# Patient Record
Sex: Male | Born: 1965 | Race: Black or African American | Hispanic: No | Marital: Single | Smoking: Former smoker
Health system: Southern US, Community
[De-identification: ages and names within clinical notes are randomized; demographics above are authoritative.]

## PROBLEM LIST (undated history)

## (undated) HISTORY — PX: WRIST SURGERY: SHX841

---

## 2018-02-28 ENCOUNTER — Emergency Department (HOSPITAL_COMMUNITY): Payer: 59

## 2018-02-28 ENCOUNTER — Other Ambulatory Visit: Payer: Self-pay

## 2018-02-28 ENCOUNTER — Observation Stay (HOSPITAL_COMMUNITY)
Admission: EM | Admit: 2018-02-28 | Discharge: 2018-03-01 | Disposition: A | Discharge status: Home / Self Care | Payer: 59 | Attending: Internal Medicine | Admitting: Internal Medicine

## 2018-02-28 ENCOUNTER — Encounter (HOSPITAL_COMMUNITY): Payer: Self-pay | Admitting: Emergency Medicine

## 2018-02-28 DIAGNOSIS — E871 Hypo-osmolality and hyponatremia: Secondary | ICD-10-CM

## 2018-02-28 DIAGNOSIS — K047 Periapical abscess without sinus: Secondary | ICD-10-CM | POA: Diagnosis not present

## 2018-02-28 DIAGNOSIS — F1020 Alcohol dependence, uncomplicated: Secondary | ICD-10-CM

## 2018-02-28 DIAGNOSIS — K0889 Other specified disorders of teeth and supporting structures: Secondary | ICD-10-CM | POA: Diagnosis present

## 2018-02-28 LAB — CBC WITH DIFFERENTIAL/PLATELET
Abs Immature Granulocytes: 0.02 10*3/uL (ref 0.00–0.07)
BASOS PCT: 1 %
Basophils Absolute: 0 10*3/uL (ref 0.0–0.1)
EOS ABS: 0.2 10*3/uL (ref 0.0–0.5)
EOS PCT: 2 %
HCT: 41.7 % (ref 39.0–52.0)
Hemoglobin: 13.1 g/dL (ref 13.0–17.0)
Immature Granulocytes: 0 %
LYMPHS ABS: 2.3 10*3/uL (ref 0.7–4.0)
Lymphocytes Relative: 26 %
MCH: 28.2 pg (ref 26.0–34.0)
MCHC: 31.4 g/dL (ref 30.0–36.0)
MCV: 89.7 fL (ref 80.0–100.0)
Monocytes Absolute: 0.7 10*3/uL (ref 0.1–1.0)
Monocytes Relative: 8 %
NRBC: 0 % (ref 0.0–0.2)
Neutro Abs: 5.6 10*3/uL (ref 1.7–7.7)
Neutrophils Relative %: 63 %
Platelets: 266 10*3/uL (ref 150–400)
RBC: 4.65 MIL/uL (ref 4.22–5.81)
RDW: 12.8 % (ref 11.5–15.5)
WBC: 8.9 10*3/uL (ref 4.0–10.5)

## 2018-02-28 LAB — BASIC METABOLIC PANEL
ANION GAP: 9 (ref 5–15)
BUN: 11 mg/dL (ref 6–20)
CALCIUM: 9.2 mg/dL (ref 8.9–10.3)
CO2: 26 mmol/L (ref 22–32)
CREATININE: 1.14 mg/dL (ref 0.61–1.24)
Chloride: 98 mmol/L (ref 98–111)
GFR calc Af Amer: >60 mL/min (ref >60)
GLUCOSE: 141 mg/dL — AB (ref 70–99)
Potassium: 3.6 mmol/L (ref 3.5–5.1)
Sodium: 133 mmol/L — ABNORMAL LOW (ref 135–145)

## 2018-02-28 MED ORDER — IOHEXOL 300 MG/ML  SOLN
75.0000 mL | Freq: Once | INTRAMUSCULAR | Status: AC | PRN
  Administered 2018-02-28: 75 mL via INTRAVENOUS

## 2018-02-28 MED ORDER — IBUPROFEN 600 MG PO TABS
600.0000 mg | ORAL_TABLET | Freq: Three times a day (TID) | ORAL | Status: DC
  Administered 2018-02-28 – 2018-03-01 (×2): 600 mg via ORAL
  Filled 2018-02-28 (×2): qty 1

## 2018-02-28 MED ORDER — ONDANSETRON HCL 4 MG/2ML IJ SOLN: 4.0000 mg | Freq: Four times a day (QID) | INTRAMUSCULAR | Status: DC | PRN

## 2018-02-28 MED ORDER — SODIUM CHLORIDE 0.9 % IV SOLN
3.0000 g | Freq: Four times a day (QID) | INTRAVENOUS | Status: DC
  Administered 2018-03-01 (×2): 3 g via INTRAVENOUS
  Filled 2018-02-28 (×7): qty 3

## 2018-02-28 MED ORDER — ENOXAPARIN SODIUM 40 MG/0.4ML ~~LOC~~ SOLN
40.0000 mg | SUBCUTANEOUS | Status: DC
  Administered 2018-02-28: 40 mg via SUBCUTANEOUS
  Filled 2018-02-28: qty 0.4

## 2018-02-28 MED ORDER — POTASSIUM CHLORIDE IN NACL 40-0.9 MEQ/L-% IV SOLN
INTRAVENOUS | Status: DC
  Administered 2018-02-28: 100 mL/h via INTRAVENOUS

## 2018-02-28 MED ORDER — ACETAMINOPHEN 650 MG RE SUPP: 650.0000 mg | Freq: Four times a day (QID) | RECTAL | Status: DC | PRN

## 2018-02-28 MED ORDER — SODIUM CHLORIDE 0.9 % IV SOLN
1.5000 g | Freq: Four times a day (QID) | INTRAVENOUS | Status: DC
  Administered 2018-02-28: 1.5 g via INTRAVENOUS
  Filled 2018-02-28: qty 1.5

## 2018-02-28 MED ORDER — SODIUM CHLORIDE 0.9 % IV SOLN
INTRAVENOUS | Status: AC
  Filled 2018-02-28 (×2): qty 3

## 2018-02-28 MED ORDER — METHYLPREDNISOLONE SODIUM SUCC 125 MG IJ SOLR
80.0000 mg | Freq: Two times a day (BID) | INTRAMUSCULAR | Status: DC
  Administered 2018-02-28 – 2018-03-01 (×2): 80 mg via INTRAVENOUS
  Filled 2018-02-28 (×2): qty 2

## 2018-02-28 MED ORDER — POLYETHYLENE GLYCOL 3350 17 G PO PACK: 17.0000 g | PACK | Freq: Every day | ORAL | Status: DC | PRN

## 2018-02-28 MED ORDER — MORPHINE SULFATE (PF) 2 MG/ML IV SOLN: 2.0000 mg | INTRAVENOUS | Status: DC | PRN

## 2018-02-28 MED ORDER — ONDANSETRON HCL 4 MG PO TABS: 4.0000 mg | ORAL_TABLET | Freq: Four times a day (QID) | ORAL | Status: DC | PRN

## 2018-02-28 MED ORDER — ACETAMINOPHEN 325 MG PO TABS: 650.0000 mg | ORAL_TABLET | Freq: Four times a day (QID) | ORAL | Status: DC | PRN

## 2018-02-28 MED ORDER — CLINDAMYCIN PHOSPHATE 600 MG/50ML IV SOLN
600.0000 mg | Freq: Once | INTRAVENOUS | Status: AC
  Administered 2018-02-28: 600 mg via INTRAVENOUS
  Filled 2018-02-28: qty 50

## 2018-02-28 NOTE — ED Provider Notes (Signed)
[facility] EMERGENCY DEPARTMENT Provider Note   CSN: [id number] Arrival date & time: 02/28/18  1433     History   Chief Complaint Chief Complaint  Patient presents with  . Dental Pain    HPI Bill Whitehead is a 52 y.o. male.  52 y.o male with no PMH presents to the ED with a chief complaint of dental abscess x 4 days. Patient reports walking up with swelling around the right side of his face. He scheduled an appointment with the dentist but was told they were unable to see him until he had his infection cleared.  This is a new problem per patient, he reports no previous episodes similar to this.  He reports the pain is worse with mastication and talking.  He states he is able to tolerate liquids but has a hard time eating any solids.  Denies pain with eye movement, fever, shortness of breath, vomiting.     History reviewed. No pertinent past medical history.  There are no active problems to display for this patient.   History reviewed. No pertinent surgical history.      Home Medications    Prior to Admission medications   Not on File    Family History History reviewed. No pertinent family history.  Social History Social History   Tobacco Use  . Smoking status: Never Smoker  . Smokeless tobacco: Never Used  Substance Use Topics  . Alcohol use: Yes    Alcohol/week: 2.0 standard drinks    Types: 2 Cans of beer per week  . Drug use: Not Currently     Allergies   Patient has no known allergies.   Review of Systems Review of Systems  Constitutional: Negative for fever.  HENT: Positive for dental problem, facial swelling and trouble swallowing.      Physical Exam Updated Vital Signs BP (!) 140/100 (BP Location: Left Arm)   Pulse 82   Temp 97.7 F (36.5 C) (Tympanic)   Resp 16   Ht 5\' 10"  (1.778 m)   Wt 99.8 kg   SpO2 98%   BMI 31.57 kg/m   Physical Exam  Constitutional: He is oriented to person, place, and time. He appears well-developed  and well-nourished.  HENT:  Head: Normocephalic and atraumatic.  Mouth/Throat: Oropharynx is clear and moist. There is trismus in the jaw. Dental abscesses present.  Difficulty opening his mouth hole, trismus is present.  No abscess is seen in the posterior aspect of the right common line.  Unable to visualize oropharyngeal region.  Eyes: Pupils are equal, round, and reactive to light. No scleral icterus.  Neck: Normal range of motion.  Cardiovascular: Normal heart sounds.  Pulmonary/Chest: Effort normal and breath sounds normal. He has no wheezes. He exhibits no tenderness.  Abdominal: Soft. Bowel sounds are normal. He exhibits no distension. There is no tenderness.  Musculoskeletal: He exhibits no tenderness or deformity.  Neurological: He is alert and oriented to person, place, and time.  Skin: Skin is warm and dry.  Nursing note and vitals reviewed.    ED Treatments / Results  Labs (all labs ordered are listed, but only abnormal results are displayed) Labs Reviewed  BASIC METABOLIC PANEL - Abnormal; Notable for the following components:      Result Value   Sodium 133 (*)    Glucose, Bld 141 (*)    All other components within normal limits  CBC WITH DIFFERENTIAL/PLATELET    EKG None  Radiology Ct Soft Tissue Neck W Contrast  Result Date: 02/28/2018 CLINICAL DATA:  52 y/o M; right upper and lower jaw pain with swelling since 02/24/2018. Sore throat. EXAM: CT NECK WITH CONTRAST TECHNIQUE: Multidetector CT imaging of the neck was performed using the standard protocol following the bolus administration of intravenous contrast. CONTRAST:  75mL OMNIPAQUE IOHEXOL 300 MG/ML  SOLN COMPARISON:  None. FINDINGS: Pharynx and larynx: Normal. No mass or swelling. Salivary glands: No inflammation, mass, or stone. Thyroid: Normal. Lymph nodes: Right upper cervical and submandibular lymph nodes are enlarged, likely reactive. No lymph node necrosis. Vascular: Negative. Limited intracranial:  Negative. Visualized orbits: Negative. Mastoids and visualized paranasal sinuses: Right greater than left maxillary sinus mucosal thickening. Skeleton: Dental carie and periapical cyst of the right posterior mandibular molar. There is a defect in the medial cortex of the mandible arising from the periapical cyst. There is a 5 mm fluid collection within the soft tissues medial to the mandible at the site of the bony defect with faint peripheral enhancement (series 2, image 37). There is mild inflammatory changes within the right medial masticator compartment, right parapharyngeal space, and in the superficial soft tissues overlying the right mandible and maxilla. Upper chest: Negative. Other: None. IMPRESSION: 1. 5 mm odontogenic abscess along the medial margin of the right mandible angle, likely associated with dental disease of the right posterior mandibular molar. 2. Mild surrounding inflammation in the right parapharyngeal space, right masticator compartment, and right face superficial soft tissues overlying mandible and maxilla. Electronically Signed   By: Mitzi Hansen M.D.   On: 02/28/2018 16:23    Procedures Procedures (including critical care time)  Medications Ordered in ED Medications  clindamycin (CLEOCIN) IVPB 600 mg (600 mg Intravenous New Bag/Given 02/28/18 1733)  iohexol (OMNIPAQUE) 300 MG/ML solution 75 mL (75 mLs Intravenous Contrast Given 02/28/18 1601)     Initial Impression / Assessment and Plan / ED Course  I have reviewed the triage vital signs and the nursing notes.  Pertinent labs & imaging results that were available during my care of the patient were reviewed by me and considered in my medical decision making (see chart for details).    He presents with abscess on the right side of his face along with increased swelling since Friday.  During examination it I am unable to fully visualize the oropharynx, or uvula.  I have discussed this patient with Dr. Jodi Mourning who  has also seen the patient with me and further recommendations are for [place] to obtain a CT soft tissue neck to further rule out any deeper involvement of this abscess.  Order CT scan along with some basic blood work to rule out any infection.  CT soft tissue neck showed: 1. 5 mm odontogenic abscess along the medial margin of the right  mandible angle, likely associated with dental disease of the right  posterior mandibular molar.  2. Mild surrounding inflammation in the right parapharyngeal space,  right masticator compartment, and right face superficial soft  tissues overlying mandible and maxilla.   No dental, [facility] on call availability will place call for ENT, I have discussed this case with Dr. Dione Housekeeper who advised call ENT Dr. Pollyann Kennedy. 4:55 PM to Dr. Pollyann Kennedy who "stated if there is no airway compromise, I will need to call administration and have them arrange for dental, OMFS coverage to be available at this time ".  Attempted to read the report to Dr. Pollyann Kennedy and he reports this is not an ENT concern.  I discussed this with Dr. Clarene Duke who advised  to call Peds dental we will try consulting them.  5:09 PM Consult peds for their recommendations.  5:12 PM Spoke to peds dentist on call who advised patient needs to receive IV antibiotics at this time.Will call hospitalist to admit patient for IV antibiotics.   5:41 PM Spoke to hospitalist who will admit patient for IV antibiotics.   Final Clinical Impressions(s) / ED Diagnoses   Final diagnoses:  Dental abscess    ED Discharge Orders    None       Claude Manges, PA-C 02/28/18 1742    Blane Ohara, MD 03/01/18 [phone]

## 2018-02-28 NOTE — ED Triage Notes (Signed)
Pt has been having R upper and lower jaw pain for several days. Cannot see dentist until "infection" is gone. Denies dental caries or broken teeth.

## 2018-02-28 NOTE — Progress Notes (Addendum)
Pharmacy Antibiotic Note  Bill Whitehead is a 52 y.o. male admitted on 02/28/2018 with dental abscess.  Pharmacy has been consulted for unasyn dosing.  Plan: unasyn 3gm iv q6h  Height: 5\' 10"  (177.8 cm) Weight: 220 lb (99.8 kg) IBW/kg (Calculated) : 73  Temp (24hrs), Avg:97.7 F (36.5 C), Min:97.7 F (36.5 C), Max:97.7 F (36.5 C)  Recent Labs  Lab 02/28/18 1513 02/28/18 1525  WBC 8.9  --   CREATININE  --  1.14    Estimated Creatinine Clearance: 91.8 mL/min (by C-G formula based on SCr of 1.14 mg/dL).    No Known Allergies  Antimicrobials this admission: 11/5 unasyn >>  11/5 clindamycin x 1   Microbiology results: none  Thank you for allowing pharmacy to be a part of this patient's care.  Bill Whitehead 02/28/2018 7:46 PM

## 2018-02-28 NOTE — H&P (Signed)
History and Physical    Bill Whitehead:[id number] DOB: 09/30/1965 DOA: 02/28/2018  PCP: Bill Whitehead, No Pcp Per   Bill Whitehead coming from: Home  Chief Complaint: Right jaw pain and swelling  HPI: Bill Whitehead is a 52 y.o. male with no significant past medical history, who presented to the ED with complaints of right jaw pain and swelling over the past 4 days.  Bill Whitehead is unable to open his mouth, or eat, he has barely eating in the past few days.  He reports pain with swallowing and chewing but no difficulty breathing.  No fever, no chills, no vomiting.  No prior history of dental problems. Bill Whitehead called his dentist first but was told he needed to get the infection treated first, before he is seen for problems with his tooth.  ED Course: Stable Vitals.  WBC 8.9.  Elevated glucose- 141, otherwise unremarkable CBC, BMP.  CT soft tissue neck with contrast- odontogenic abscess along the medial margin of the right month angle, likely associated with dental disease of the right posterior mandibular molar.  Mild surrounding inflammation in the right parapharyngeal space.  IV clindamycin started in ED. No dental surgeon on call. EDP contacted ENT on call- but recommendations were not given, as this was a dental problems.  Pediatric dentist on call, Dr. Millner-recommended admission to the hospital for IV antibiotics and at this time did not think this needed surgical evaluation.  Review of Systems: As per HPI all other systems reviewed and negative  Reviewed- Bill Whitehead has no past medical history  History reviewed. No pertinent surgical history.   reports that he has never smoked. He has never used smokeless tobacco. He reports that he drinks about 2.0 standard drinks of alcohol per week. He reports that he has current or past drug history.  No Known Allergies  Family History  Problem Relation Age of Onset  . Diabetes Mother     Prior to Admission medications   Not on File    Physical  Exam: Vitals:   02/28/18 1439 02/28/18 1730 02/28/18 1800 02/28/18 1830  BP: (!) 145/96 (!) 140/100 128/86 (!) 135/93  Pulse: 91 82 77 83  Resp: 20 16 16 16   Temp: 97.7 F (36.5 C)     TempSrc: Tympanic     SpO2: 94% 98% 95% 99%  Weight: 99.8 kg     Height: 5\' 10"  (1.778 m)       Constitutional: NAD, calm, comfortable Vitals:   02/28/18 1439 02/28/18 1730 02/28/18 1800 02/28/18 1830  BP: (!) 145/96 (!) 140/100 128/86 (!) 135/93  Pulse: 91 82 77 83  Resp: 20 16 16 16   Temp: 97.7 F (36.5 C)     TempSrc: Tympanic     SpO2: 94% 98% 95% 99%  Weight: 99.8 kg     Height: 5\' 10"  (1.778 m)      Eyes: PERRL, lids and conjunctivae normal ENMT: Mucous membranes are moist.  Unable to visualize posterior pharynx.  Unable to open his mouth > 4cm 2/2 pain, swelling to mild- moderate swelling to right side of face, with tenderness on mild palpation, extending to right cervical region.  neck: normal, supple, no masses, no thyromegaly Respiratory: clear to auscultation bilaterally, no wheezing, no crackles. Normal respiratory effort. No accessory muscle use.  Cardiovascular: Regular rate and rhythm, no murmurs / rubs / gallops. No extremity edema. 2+ pedal pulses. No carotid bruits.  Abdomen: no tenderness, no masses palpated. No hepatosplenomegaly. Bowel sounds positive.  Musculoskeletal: no  clubbing / cyanosis. No joint deformity upper and lower extremities. Good ROM, no contractures. Normal muscle tone.  Skin: no rashes, lesions, ulcers. No induration Neurologic: CN 2-12 grossly intact. Sensation intact, DTR normal. Strength 5/5 in all 4.  Psychiatric: Normal judgment and insight. Alert and oriented x 3. Normal mood.   Labs on Admission: I have personally reviewed following labs and imaging studies  CBC: Recent Labs  Lab 02/28/18 1513  WBC 8.9  NEUTROABS 5.6  HGB 13.1  HCT 41.7  MCV 89.7  PLT 266   Basic Metabolic Panel: Recent Labs  Lab 02/28/18 1525  NA 133*  K 3.6  CL 98    CO2 26  GLUCOSE 141*  BUN 11  CREATININE 1.14  CALCIUM 9.2   Urine analysis: No results found for: COLORURINE, APPEARANCEUR, LABSPEC, PHURINE, GLUCOSEU, HGBUR, BILIRUBINUR, KETONESUR, PROTEINUR, UROBILINOGEN, NITRITE, LEUKOCYTESUR  Radiological Exams on Admission: Ct Soft Tissue Neck W Contrast  Result Date: 02/28/2018 CLINICAL DATA:  52 y/o M; right upper and lower jaw pain with swelling since 02/24/2018. Sore throat. EXAM: CT NECK WITH CONTRAST TECHNIQUE: Multidetector CT imaging of the neck was performed using the standard protocol following the bolus administration of intravenous contrast. CONTRAST:  75mL OMNIPAQUE IOHEXOL 300 MG/ML  SOLN COMPARISON:  None. FINDINGS: Pharynx and larynx: Normal. No mass or swelling. Salivary glands: No inflammation, mass, or stone. Thyroid: Normal. Lymph nodes: Right upper cervical and submandibular lymph nodes are enlarged, likely reactive. No lymph node necrosis. Vascular: Negative. Limited intracranial: Negative. Visualized orbits: Negative. Mastoids and visualized paranasal sinuses: Right greater than left maxillary sinus mucosal thickening. Skeleton: Dental carie and periapical cyst of the right posterior mandibular molar. There is a defect in the medial cortex of the mandible arising from the periapical cyst. There is a 5 mm fluid collection within the soft tissues medial to the mandible at the site of the bony defect with faint peripheral enhancement (series 2, image 37). There is mild inflammatory changes within the right medial masticator compartment, right parapharyngeal space, and in the superficial soft tissues overlying the right mandible and maxilla. Upper chest: Negative. Other: None. IMPRESSION: 1. 5 mm odontogenic abscess along the medial margin of the right mandible angle, likely associated with dental disease of the right posterior mandibular molar. 2. Mild surrounding inflammation in the right parapharyngeal space, right masticator compartment,  and right face superficial soft tissues overlying mandible and maxilla. Electronically Signed   By: Mitzi Hansen M.D.   On: 02/28/2018 16:23    EKG: None.  Assessment/Plan Active Problems:   Dental abscess   Dental abscess-CT soft tissue neck with contrast- 5mm odontogenic abscess right mandible angle, mild surrounding inflammation right parapharyngeal space.  IV clindamycin started in ED -Abscess quite small, but recommend talking to oral surgeon on call tomorrow before 4 PM, if surgical evaluation needed. -Will start IV Unasyn-per pharmacy for better strep and anaerobic coverage -HIV -Glucose 141, diabetic mother, check hemoglobin A1c -IV fluid normal saline +40 KCl 100 cc/h x 15 hours -IV Solu-Medrol 80 twice daily x3 doses -Ibuprofen 600mg  X 3 dose - Morphine 2mg  q4h PRN -Full liquid diet advance as tolerated   DVT prophylaxis: Lovenox Code Status: full Family Communication: None at bedside Disposition Plan: Per rounding team Consults called: none Admission status: Inpatient, med-surg   Onnie Boer MD [facility] Pager [phone] From 3PM-11PM.  Otherwise please contact night-coverage [email or web address] Password [email or web address]   02/28/2018, 8:00 PM

## 2018-03-01 DIAGNOSIS — E871 Hypo-osmolality and hyponatremia: Secondary | ICD-10-CM | POA: Diagnosis not present

## 2018-03-01 DIAGNOSIS — K047 Periapical abscess without sinus: Secondary | ICD-10-CM | POA: Diagnosis not present

## 2018-03-01 DIAGNOSIS — F1020 Alcohol dependence, uncomplicated: Secondary | ICD-10-CM | POA: Diagnosis not present

## 2018-03-01 LAB — HEMOGLOBIN A1C
HEMOGLOBIN A1C: 5.9 % — AB (ref 4.8–5.6)
MEAN PLASMA GLUCOSE: 122.63 mg/dL

## 2018-03-01 MED ORDER — AMOXICILLIN-POT CLAVULANATE 875-125 MG PO TABS
1.0000 | ORAL_TABLET | Freq: Two times a day (BID) | ORAL | Status: DC
  Administered 2018-03-01: 1 via ORAL
  Filled 2018-03-01: qty 1

## 2018-03-01 MED ORDER — AMOXICILLIN-POT CLAVULANATE 875-125 MG PO TABS: 1.0000 | ORAL_TABLET | Freq: Two times a day (BID) | ORAL | 0 refills | Status: DC

## 2018-03-01 MED ORDER — ENOXAPARIN SODIUM 60 MG/0.6ML ~~LOC~~ SOLN: 50.0000 mg | SUBCUTANEOUS | Status: DC

## 2018-03-01 NOTE — Progress Notes (Signed)
Pt IV removed, WNL. D/C instructions given to pt, verbalized understanding. Pt awaiting [facility] transport to take to oral surgeon via hospitalist orders.

## 2018-03-01 NOTE — Discharge Summary (Signed)
Physician Discharge Summary  Bill Whitehead JXB:[id number] DOB: 1965/04/28 DOA: 02/28/2018  PCP: Patient, No Pcp Per  Admit date: 02/28/2018 Discharge date: 03/01/2018  Admitted From: Home Disposition:  Home   Recommendations for Outpatient Follow-up:  1. Follow up with PCP in 1-2 weeks 2. Please obtain BMP/CBC in one week    Discharge Condition: Stable CODE STATUS: FULL Diet recommendation:  Regular   Brief/Interim Summary: 52 year old male with no documented chronic medical problems presented with 4-day history of right jaw pain and swelling.  The patient first noted some dental pain in his lower molar area on the right on 02/23/2018.  The patient took some over-the-counter pain relievers.  When he woke up in the morning of 02/24/2018, he had significant worsening, but did not seek any medical attention.  He denied any fevers, chills, chest pain, shortness breath, nausea, vomiting or diarrhea.  His pain and swelling progressed to the point where he is unable to open his mouth and eat.  As result, the patient presented to emergency department for further evaluation.  He stated he tried to call a dentist on 02/28/2018, and he stated that he was told that he would need antibiotics before further treatment.  Upon presentation, the patient was afebrile hemodynamically stable saturating 98% on room air.  WBC was 18.9.  CT of the neck showed dental caries and periapical cyst of the right posterior molar.  There was a 5 mm fluid collection along the medial margin of the right mandible angle likely associated with dental disease.  The patient was started on IV Unasyn and IV Solu-Medrol.  Over a period of 24 hours, the patient has significant improvement.  Consult was discussed with oral surgery, Dr. Ocie Doyne.  Dr. Barbette Merino was gracious and willing to see the patient in the office in the afternoon of 03/01/2018.  This was discussed with the patient, and the patient felt comfortable with the plan and the  patient stated that he was willing to arrive at Dr. Henriette Combs office on the afternoon of 03/01/2018 to be evaluated further.  At the time of discharge, the patient was able to open his mouth and tolerate oral intake.  Discharge Diagnoses:  Odontogenic abscess -Initially started on IV Unasyn -Received 2 doses IV Solu-Medrol -CT neck as discussed above -Case discussed with oral surgery, Dr. Ocie Doyne who agreed to see the patient in the office after discharge -Instructions provided to the patient to go to Dr. Lorin Picket Johnson's office to be seen in the afternoon of 03/01/2018 -Discharge home with amox/clav  Alcohol dependence -No signs of withdrawal -The patient states that he drinks 2 x 40 oz beers daily -Cessation discussed  Hyponatremia -Secondary to volume depletion -Patient received IV saline during the hospitalization  Discharge Instructions   Allergies as of 03/01/2018   No Known Allergies     Medication List    STOP taking these medications   ibuprofen 200 MG tablet Commonly known as:  ADVIL,MOTRIN     TAKE these medications   amoxicillin-clavulanate 875-125 MG tablet Commonly known as:  AUGMENTIN Take 1 tablet by mouth every 12 (twelve) hours.       No Known Allergies  Consultations:  Oral surgery--Dr. Ocie Doyne   Procedures/Studies: Ct Soft Tissue Neck W Contrast  Result Date: 02/28/2018 CLINICAL DATA:  52 y/o M; right upper and lower jaw pain with swelling since 02/24/2018. Sore throat. EXAM: CT NECK WITH CONTRAST TECHNIQUE: Multidetector CT imaging of the neck was performed using the standard protocol  following the bolus administration of intravenous contrast. CONTRAST:  75mL OMNIPAQUE IOHEXOL 300 MG/ML  SOLN COMPARISON:  None. FINDINGS: Pharynx and larynx: Normal. No mass or swelling. Salivary glands: No inflammation, mass, or stone. Thyroid: Normal. Lymph nodes: Right upper cervical and submandibular lymph nodes are enlarged, likely reactive. No lymph  node necrosis. Vascular: Negative. Limited intracranial: Negative. Visualized orbits: Negative. Mastoids and visualized paranasal sinuses: Right greater than left maxillary sinus mucosal thickening. Skeleton: Dental carie and periapical cyst of the right posterior mandibular molar. There is a defect in the medial cortex of the mandible arising from the periapical cyst. There is a 5 mm fluid collection within the soft tissues medial to the mandible at the site of the bony defect with faint peripheral enhancement (series 2, image 37). There is mild inflammatory changes within the right medial masticator compartment, right parapharyngeal space, and in the superficial soft tissues overlying the right mandible and maxilla. Upper chest: Negative. Other: None. IMPRESSION: 1. 5 mm odontogenic abscess along the medial margin of the right mandible angle, likely associated with dental disease of the right posterior mandibular molar. 2. Mild surrounding inflammation in the right parapharyngeal space, right masticator compartment, and right face superficial soft tissues overlying mandible and maxilla. Electronically Signed   By: Mitzi Hansen M.D.   On: 02/28/2018 16:23         Discharge Exam: Vitals:   02/28/18 2029 03/01/18 0614  BP: (!) 137/99 124/82  Pulse: 93 75  Resp: 18 18  Temp: 99.1 F (37.3 C) 98.2 F (36.8 C)  SpO2: 97% 97%   Vitals:   02/28/18 1800 02/28/18 1830 02/28/18 2029 03/01/18 0614  BP: 128/86 (!) 135/93 (!) 137/99 124/82  Pulse: 77 83 93 75  Resp: 16 16 18 18   Temp:   99.1 F (37.3 C) 98.2 F (36.8 C)  TempSrc:    Oral  SpO2: 95% 99% 97% 97%  Weight:    108.5 kg  Height:        General: Pt is alert, awake, not in acute distress Cardiovascular: RRR, S1/S2 +, no rubs, no gallops Respiratory: CTA bilaterally, no wheezing, no rhonchi Abdominal: Soft, NT, ND, bowel sounds + Extremities: no edema, no cyanosis -mouth--impacted molar/wisdom tooth, right lower jaw  without surrounding necrosis;  Shotty anterior cervical lymphadenopathy without erythema or crepitance   The results of significant diagnostics from this hospitalization (including imaging, microbiology, ancillary and laboratory) are listed below for reference.    Significant Diagnostic Studies: Ct Soft Tissue Neck W Contrast  Result Date: 02/28/2018 CLINICAL DATA:  52 y/o M; right upper and lower jaw pain with swelling since 02/24/2018. Sore throat. EXAM: CT NECK WITH CONTRAST TECHNIQUE: Multidetector CT imaging of the neck was performed using the standard protocol following the bolus administration of intravenous contrast. CONTRAST:  75mL OMNIPAQUE IOHEXOL 300 MG/ML  SOLN COMPARISON:  None. FINDINGS: Pharynx and larynx: Normal. No mass or swelling. Salivary glands: No inflammation, mass, or stone. Thyroid: Normal. Lymph nodes: Right upper cervical and submandibular lymph nodes are enlarged, likely reactive. No lymph node necrosis. Vascular: Negative. Limited intracranial: Negative. Visualized orbits: Negative. Mastoids and visualized paranasal sinuses: Right greater than left maxillary sinus mucosal thickening. Skeleton: Dental carie and periapical cyst of the right posterior mandibular molar. There is a defect in the medial cortex of the mandible arising from the periapical cyst. There is a 5 mm fluid collection within the soft tissues medial to the mandible at the site of the bony defect with faint peripheral  enhancement (series 2, image 37). There is mild inflammatory changes within the right medial masticator compartment, right parapharyngeal space, and in the superficial soft tissues overlying the right mandible and maxilla. Upper chest: Negative. Other: None. IMPRESSION: 1. 5 mm odontogenic abscess along the medial margin of the right mandible angle, likely associated with dental disease of the right posterior mandibular molar. 2. Mild surrounding inflammation in the right parapharyngeal space, right  masticator compartment, and right face superficial soft tissues overlying mandible and maxilla. Electronically Signed   By: Mitzi Hansen M.D.   On: 02/28/2018 16:23     Microbiology: No results found for this or any previous visit (from the past 240 hour(s)).   Labs: Basic Metabolic Panel: Recent Labs  Lab 02/28/18 1525  NA 133*  K 3.6  CL 98  CO2 26  GLUCOSE 141*  BUN 11  CREATININE 1.14  CALCIUM 9.2   Liver Function Tests: No results for input(s): AST, ALT, ALKPHOS, BILITOT, PROT, ALBUMIN in the last 168 hours. No results for input(s): LIPASE, AMYLASE in the last 168 hours. No results for input(s): AMMONIA in the last 168 hours. CBC: Recent Labs  Lab 02/28/18 1513  WBC 8.9  NEUTROABS 5.6  HGB 13.1  HCT 41.7  MCV 89.7  PLT 266   Cardiac Enzymes: No results for input(s): CKTOTAL, CKMB, CKMBINDEX, TROPONINI in the last 168 hours. BNP: Invalid input(s): POCBNP CBG: No results for input(s): GLUCAP in the last 168 hours.  Time coordinating discharge:  36 minutes  Signed:  [name], DO [facility] Pager: [phone] 03/01/2018, 7:53 AM

## 2018-03-02 LAB — HIV ANTIBODY (ROUTINE TESTING W REFLEX): HIV SCREEN 4TH GENERATION: NONREACTIVE

## 2019-04-22 IMAGING — CT CT NECK W/ CM
4 of 5 series · 15 of 33 positions shown, 17 images · IV contrast (omnipaque)
Comparison: None.

CLINICAL DATA: 52 y/o M; right upper and lower jaw pain with
swelling since 02/24/2018. Sore throat.

EXAM:
CT NECK WITH CONTRAST
TECHNIQUE: Multidetector CT imaging of the neck was performed using the
standard protocol following the bolus administration of intravenous
contrast.
CONTRAST:  75mL OMNIPAQUE IOHEXOL 300 MG/ML  SOLN

[Series 2: axial neck · axial · 0.51mm/px · z∈[+1070,+1166]mm · 3 of 120 slices shown]
[im 24/120  bone]
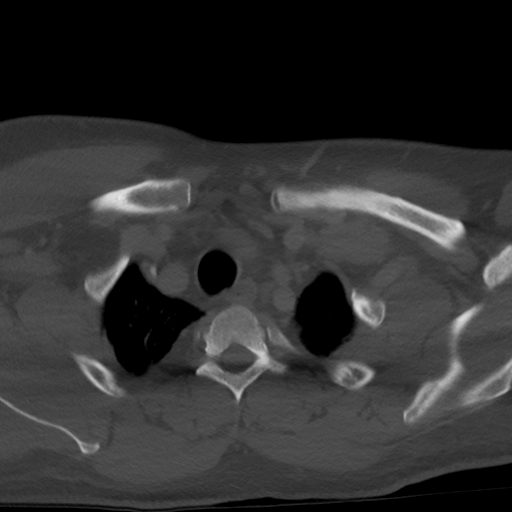
[im 48/120  bone]
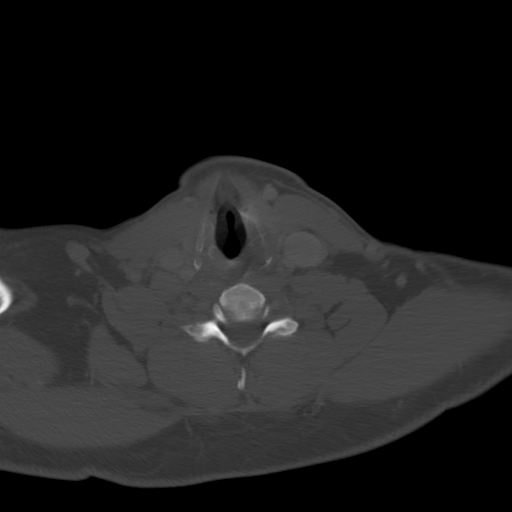
[im 72/120  bone]
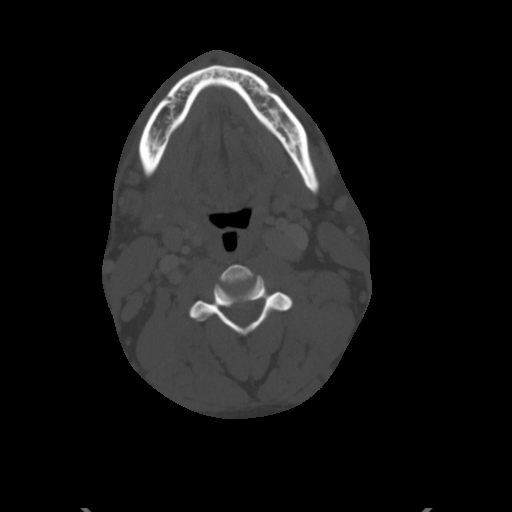

[Series 6: coronal neck · coronal · 0.51mm/px · 3 of 109 slices shown]
[im 22/109  bone]
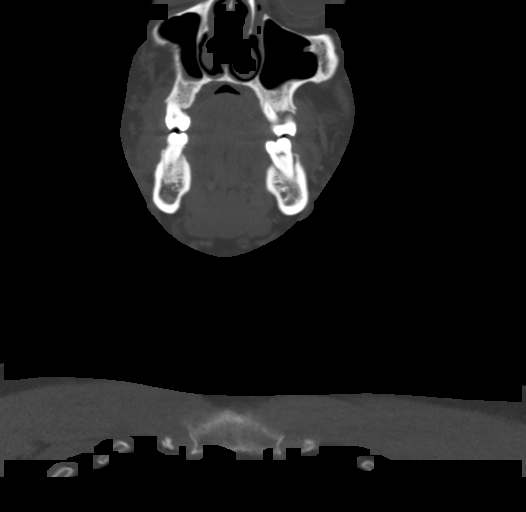
[im 44/109  bone]
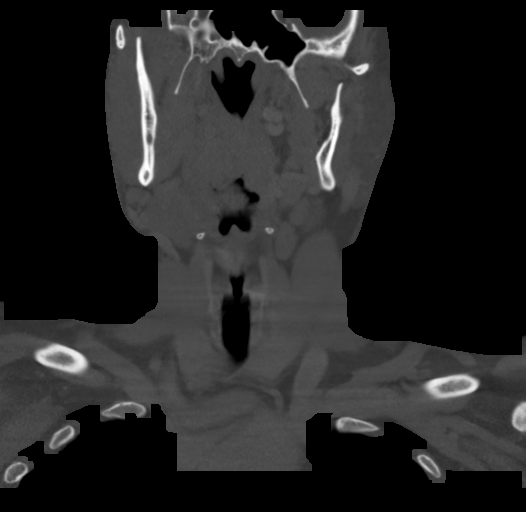
[im 65/109  bone]
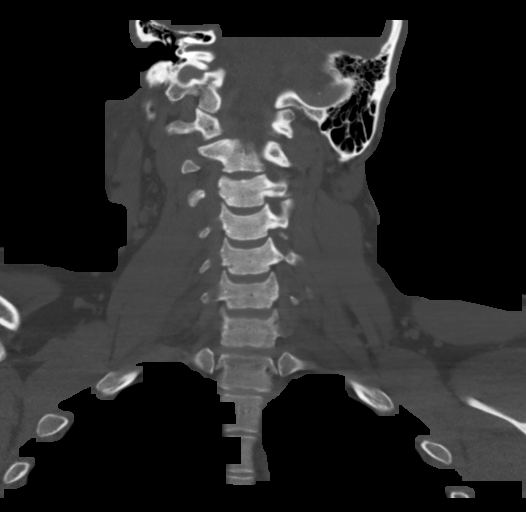

[Series 7: sagittal neck · sagittal · 0.47mm/px · 5 of 101 slices shown, 6 images]
[im 34/101  bone]
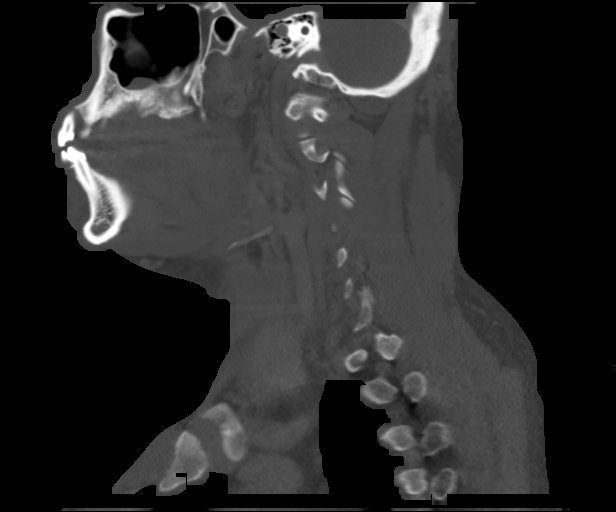
[im 42/101  bone]
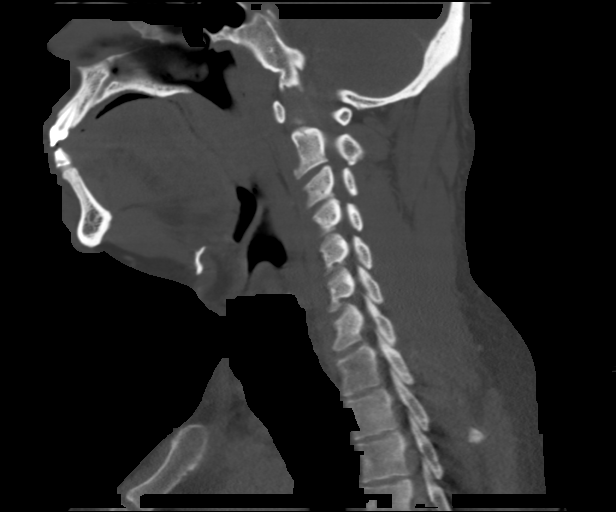
[im 51/101  soft-tissue]
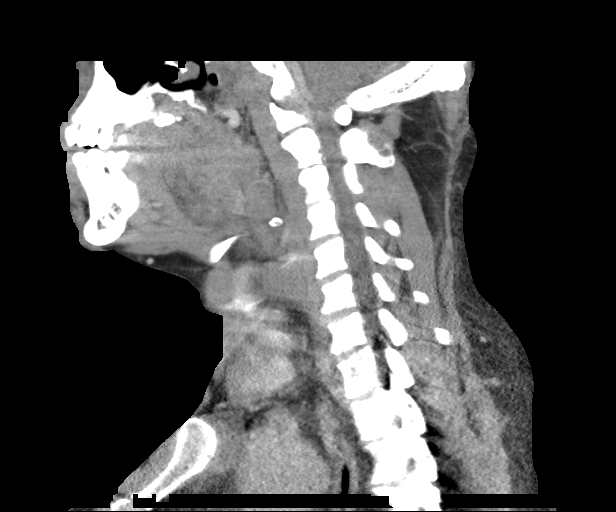
[im 51/101  bone]
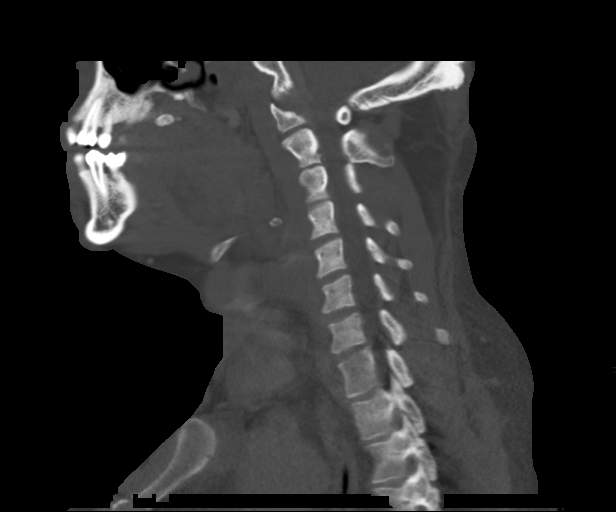
[im 59/101  bone]
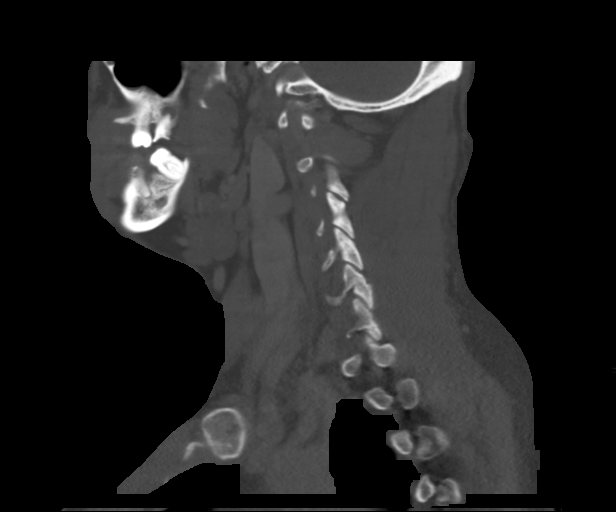
[im 67/101  bone]
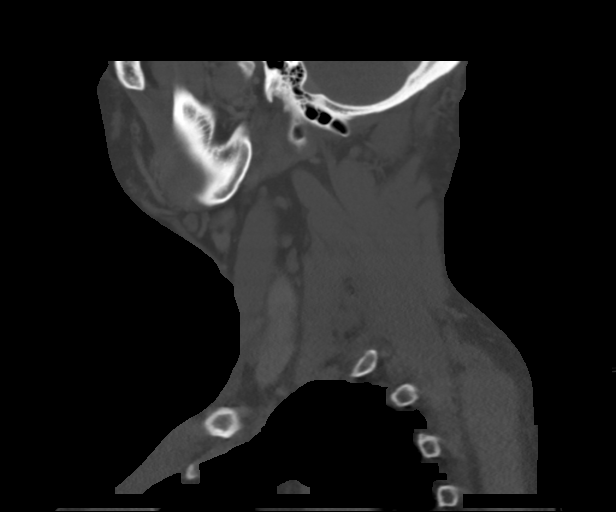

[Series 8: orthogonal ax · axial · 0.39mm/px · z∈[+1046,+1206]mm · 4 of 137 slices shown, 5 images]
[im 28/137  soft-tissue]
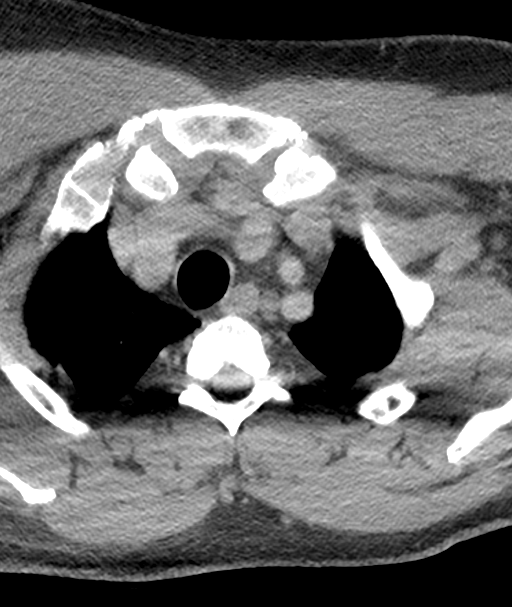
[im 28/137  bone]
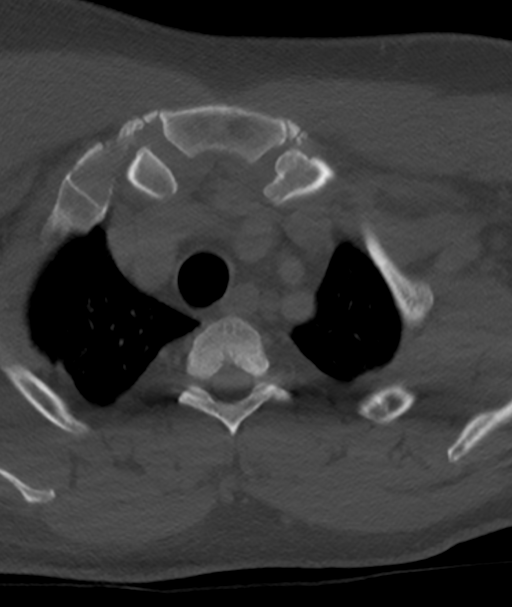
[im 55/137  bone]
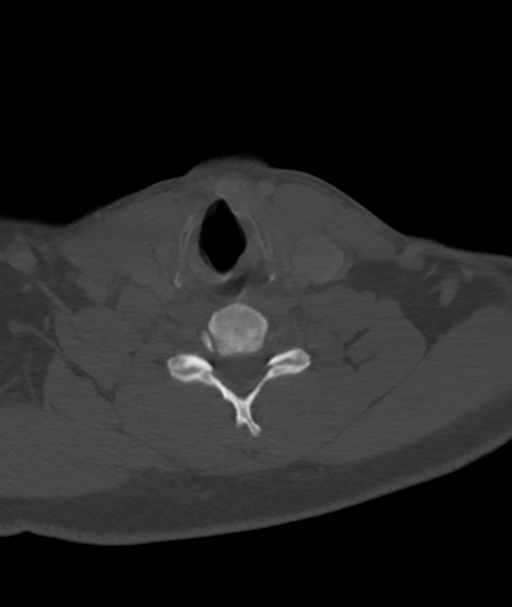
[im 82/137  bone]
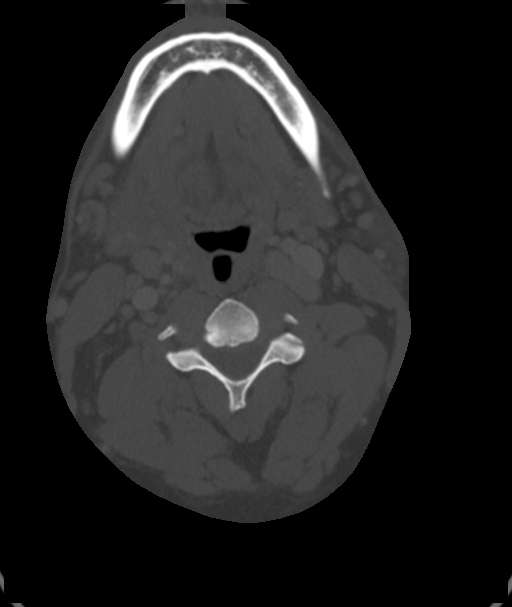
[im 109/137  bone]
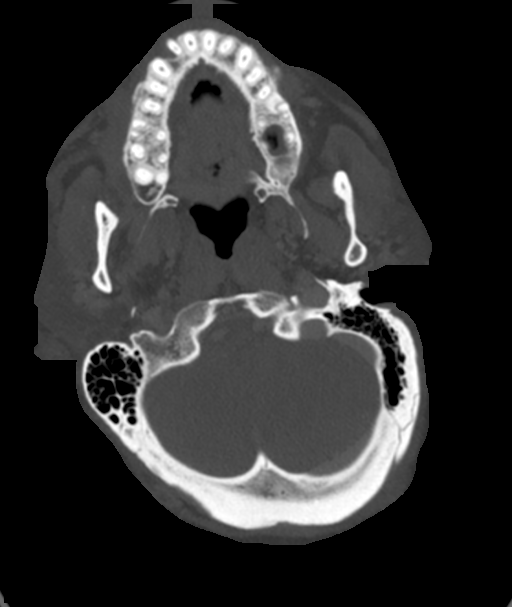

[15 of 33 positions shown; findings below may reference images not displayed]

FINDINGS: Pharynx and larynx: Normal. No mass or swelling.

Salivary glands: No inflammation, mass, or stone.

Thyroid: Normal.

Lymph nodes: Right upper cervical and submandibular lymph nodes are
enlarged, likely reactive. No lymph node necrosis.

Vascular: Negative.

Limited intracranial: Negative.

Visualized orbits: Negative.

Mastoids and visualized paranasal sinuses: Right greater than left
maxillary sinus mucosal thickening.

Skeleton: Dental Boris Ivan and periapical cyst of the right posterior
mandibular molar. There is a defect in the medial cortex of the
mandible arising from the periapical cyst. There is a 5 mm fluid
collection within the soft tissues medial to the mandible at the
site of the bony defect with faint peripheral enhancement (series 2,
image 37). There is mild inflammatory changes within the right
medial masticator compartment, right parapharyngeal space, and in
the superficial soft tissues overlying the right mandible and
maxilla.

Upper chest: Negative.

Other: None.
IMPRESSION: 1. 5 mm odontogenic abscess along the medial margin of the right
mandible angle, likely associated with dental disease of the right
posterior mandibular molar.
2. Mild surrounding inflammation in the right parapharyngeal space,
right masticator compartment, and right face superficial soft
tissues overlying mandible and maxilla.

## 2020-02-04 ENCOUNTER — Encounter (HOSPITAL_COMMUNITY): Payer: Self-pay | Admitting: *Deleted

## 2020-02-04 ENCOUNTER — Emergency Department (HOSPITAL_COMMUNITY): Payer: HRSA Program

## 2020-02-04 ENCOUNTER — Other Ambulatory Visit: Payer: Self-pay

## 2020-02-04 ENCOUNTER — Inpatient Hospital Stay (HOSPITAL_COMMUNITY)
Admission: EM | Admit: 2020-02-04 | Discharge: 2020-02-07 | DRG: 177 | Disposition: A | Discharge status: Home / Self Care | Payer: HRSA Program | Attending: Family Medicine | Admitting: Family Medicine

## 2020-02-04 DIAGNOSIS — Z6832 Body mass index (BMI) 32.0-32.9, adult: Secondary | ICD-10-CM | POA: Diagnosis not present

## 2020-02-04 DIAGNOSIS — U071 COVID-19: Secondary | ICD-10-CM | POA: Diagnosis present

## 2020-02-04 DIAGNOSIS — J1282 Pneumonia due to coronavirus disease 2019: Secondary | ICD-10-CM | POA: Diagnosis present

## 2020-02-04 DIAGNOSIS — J069 Acute upper respiratory infection, unspecified: Secondary | ICD-10-CM | POA: Diagnosis not present

## 2020-02-04 DIAGNOSIS — E669 Obesity, unspecified: Secondary | ICD-10-CM | POA: Diagnosis present

## 2020-02-04 DIAGNOSIS — R0902 Hypoxemia: Secondary | ICD-10-CM | POA: Diagnosis present

## 2020-02-04 DIAGNOSIS — J9601 Acute respiratory failure with hypoxia: Secondary | ICD-10-CM | POA: Diagnosis present

## 2020-02-04 DIAGNOSIS — F1010 Alcohol abuse, uncomplicated: Secondary | ICD-10-CM | POA: Diagnosis present

## 2020-02-04 LAB — COMPREHENSIVE METABOLIC PANEL
ALT: 45 U/L — ABNORMAL HIGH (ref 0–44)
AST: 45 U/L — ABNORMAL HIGH (ref 15–41)
Albumin: 4.1 g/dL (ref 3.5–5.0)
Alkaline Phosphatase: 46 U/L (ref 38–126)
Anion gap: 14 (ref 5–15)
BUN: 15 mg/dL (ref 6–20)
CO2: 23 mmol/L (ref 22–32)
Calcium: 9 mg/dL (ref 8.9–10.3)
Chloride: 97 mmol/L — ABNORMAL LOW (ref 98–111)
Creatinine, Ser: 1.31 mg/dL — ABNORMAL HIGH (ref 0.61–1.24)
GFR, Estimated: >60 mL/min (ref >60)
Glucose, Bld: 106 mg/dL — ABNORMAL HIGH (ref 70–99)
Potassium: 4 mmol/L (ref 3.5–5.1)
Sodium: 134 mmol/L — ABNORMAL LOW (ref 135–145)
Total Bilirubin: 1.3 mg/dL — ABNORMAL HIGH (ref 0.3–1.2)
Total Protein: 8.6 g/dL — ABNORMAL HIGH (ref 6.5–8.1)

## 2020-02-04 LAB — CBC WITH DIFFERENTIAL/PLATELET
Abs Immature Granulocytes: 0.07 10*3/uL (ref 0.00–0.07)
Basophils Absolute: 0 10*3/uL (ref 0.0–0.1)
Basophils Relative: 0 %
Eosinophils Absolute: 0 10*3/uL (ref 0.0–0.5)
Eosinophils Relative: 0 %
HCT: 42.2 % (ref 39.0–52.0)
Hemoglobin: 13.7 g/dL (ref 13.0–17.0)
Immature Granulocytes: 1 %
Lymphocytes Relative: 13 %
Lymphs Abs: 1.3 10*3/uL (ref 0.7–4.0)
MCH: 29 pg (ref 26.0–34.0)
MCHC: 32.5 g/dL (ref 30.0–36.0)
MCV: 89.4 fL (ref 80.0–100.0)
Monocytes Absolute: 0.4 10*3/uL (ref 0.1–1.0)
Monocytes Relative: 4 %
Neutro Abs: 8.2 10*3/uL — ABNORMAL HIGH (ref 1.7–7.7)
Neutrophils Relative %: 82 %
Platelets: 222 10*3/uL (ref 150–400)
RBC: 4.72 MIL/uL (ref 4.22–5.81)
RDW: 13.9 % (ref 11.5–15.5)
WBC: 10 10*3/uL (ref 4.0–10.5)
nRBC: 0 % (ref 0.0–0.2)

## 2020-02-04 LAB — D-DIMER, QUANTITATIVE: D-Dimer, Quant: 1.75 ug/mL-FEU — ABNORMAL HIGH (ref 0.00–0.50)

## 2020-02-04 LAB — FIBRINOGEN: Fibrinogen: >800 mg/dL — ABNORMAL HIGH (ref 210–475)

## 2020-02-04 LAB — LACTIC ACID, PLASMA
Lactic Acid, Venous: 2.1 mmol/L (ref 0.5–1.9)
Lactic Acid, Venous: 1.1 mmol/L (ref 0.5–1.9)

## 2020-02-04 LAB — RESPIRATORY PANEL BY RT PCR (FLU A&B, COVID)
Influenza A by PCR: NEGATIVE
Influenza B by PCR: NEGATIVE
SARS Coronavirus 2 by RT PCR: POSITIVE — AB

## 2020-02-04 LAB — C-REACTIVE PROTEIN: CRP: 15.7 mg/dL — ABNORMAL HIGH (ref <1.0)

## 2020-02-04 LAB — LACTATE DEHYDROGENASE: LDH: 302 U/L — ABNORMAL HIGH (ref 98–192)

## 2020-02-04 LAB — TRIGLYCERIDES: Triglycerides: 70 mg/dL (ref <150)

## 2020-02-04 LAB — PROCALCITONIN: Procalcitonin: 0.27 ng/mL

## 2020-02-04 LAB — FERRITIN: Ferritin: 649 ng/mL — ABNORMAL HIGH (ref 24–336)

## 2020-02-04 MED ORDER — ENOXAPARIN SODIUM 40 MG/0.4ML ~~LOC~~ SOLN
40.0000 mg | SUBCUTANEOUS | Status: DC
  Administered 2020-02-04: 40 mg via SUBCUTANEOUS
  Filled 2020-02-04: qty 0.4

## 2020-02-04 MED ORDER — ACETAMINOPHEN 325 MG PO TABS
650.0000 mg | ORAL_TABLET | Freq: Once | ORAL | Status: AC
  Administered 2020-02-04: 650 mg via ORAL
  Filled 2020-02-04: qty 2

## 2020-02-04 MED ORDER — SODIUM CHLORIDE 0.45 % IV SOLN: INTRAVENOUS | Status: AC

## 2020-02-04 MED ORDER — ONDANSETRON HCL 4 MG PO TABS: 4.0000 mg | ORAL_TABLET | Freq: Four times a day (QID) | ORAL | Status: DC | PRN

## 2020-02-04 MED ORDER — HYDROCODONE-ACETAMINOPHEN 5-325 MG PO TABS
1.0000 | ORAL_TABLET | ORAL | Status: DC | PRN
  Administered 2020-02-04: 2 via ORAL
  Filled 2020-02-04: qty 2

## 2020-02-04 MED ORDER — GUAIFENESIN-DM 100-10 MG/5ML PO SYRP
10.0000 mL | ORAL_SOLUTION | ORAL | Status: DC | PRN
  Administered 2020-02-05 (×2): 10 mL via ORAL
  Filled 2020-02-04 (×2): qty 10

## 2020-02-04 MED ORDER — HYDROCOD POLST-CPM POLST ER 10-8 MG/5ML PO SUER: 5.0000 mL | Freq: Two times a day (BID) | ORAL | Status: DC | PRN

## 2020-02-04 MED ORDER — ACETAMINOPHEN 325 MG PO TABS
650.0000 mg | ORAL_TABLET | Freq: Four times a day (QID) | ORAL | Status: DC | PRN
  Administered 2020-02-05: 650 mg via ORAL
  Filled 2020-02-04: qty 2

## 2020-02-04 MED ORDER — METHYLPREDNISOLONE SODIUM SUCC 500 MG IJ SOLR: 0.5000 mg/kg | Freq: Two times a day (BID) | INTRAMUSCULAR | Status: DC

## 2020-02-04 MED ORDER — ASCORBIC ACID 500 MG PO TABS
500.0000 mg | ORAL_TABLET | Freq: Every day | ORAL | Status: DC
  Administered 2020-02-04 – 2020-02-07 (×4): 500 mg via ORAL
  Filled 2020-02-04 (×4): qty 1

## 2020-02-04 MED ORDER — SODIUM CHLORIDE 0.9 % IV SOLN
100.0000 mg | Freq: Once | INTRAVENOUS | Status: AC
  Administered 2020-02-04: 100 mg via INTRAVENOUS
  Filled 2020-02-04: qty 20

## 2020-02-04 MED ORDER — PREDNISONE 20 MG PO TABS: 50.0000 mg | ORAL_TABLET | Freq: Every day | ORAL | Status: DC

## 2020-02-04 MED ORDER — SODIUM CHLORIDE 0.9 % IV SOLN: 200.0000 mg | Freq: Once | INTRAVENOUS | Status: DC

## 2020-02-04 MED ORDER — ONDANSETRON HCL 4 MG/2ML IJ SOLN: 4.0000 mg | Freq: Four times a day (QID) | INTRAMUSCULAR | Status: DC | PRN

## 2020-02-04 MED ORDER — PREDNISONE 50 MG PO TABS: 50.0000 mg | ORAL_TABLET | Freq: Every day | ORAL | Status: DC

## 2020-02-04 MED ORDER — SODIUM CHLORIDE 0.9 % IV SOLN: 100.0000 mg | Freq: Every day | INTRAVENOUS | Status: DC

## 2020-02-04 MED ORDER — METHYLPREDNISOLONE SODIUM SUCC 125 MG IJ SOLR
0.5000 mg/kg | Freq: Two times a day (BID) | INTRAMUSCULAR | Status: DC
  Administered 2020-02-05 – 2020-02-07 (×4): 50 mg via INTRAVENOUS
  Filled 2020-02-04 (×4): qty 2

## 2020-02-04 MED ORDER — ZINC SULFATE 220 (50 ZN) MG PO CAPS
220.0000 mg | ORAL_CAPSULE | Freq: Every day | ORAL | Status: DC
  Administered 2020-02-04 – 2020-02-07 (×4): 220 mg via ORAL
  Filled 2020-02-04 (×4): qty 1

## 2020-02-04 MED ORDER — INFLUENZA VAC SPLIT QUAD 0.5 ML IM SUSY: 0.5000 mL | PREFILLED_SYRINGE | INTRAMUSCULAR | Status: DC

## 2020-02-04 MED ORDER — SODIUM CHLORIDE 0.9 % IV SOLN
100.0000 mg | Freq: Every day | INTRAVENOUS | Status: DC
  Administered 2020-02-05 – 2020-02-07 (×3): 100 mg via INTRAVENOUS
  Filled 2020-02-04 (×3): qty 20

## 2020-02-04 MED ORDER — DEXAMETHASONE SODIUM PHOSPHATE 10 MG/ML IJ SOLN
8.0000 mg | Freq: Once | INTRAMUSCULAR | Status: AC
  Administered 2020-02-04: 8 mg via INTRAVENOUS
  Filled 2020-02-04: qty 1

## 2020-02-04 NOTE — ED Notes (Signed)
Date and time results received: 02/04/20 1629 (use smartphrase ".now" to insert current time)  Test: lactic acid Critical Value: 2.1  Name of Provider Notified: Deforest Hoyles PA  Orders Received? Or Actions Taken?: na

## 2020-02-04 NOTE — H&P (Addendum)
[facility] History & Physical  [name] MD  Bill Whitehead 02/04/2020  Chief Complaint: SOB HPI: The patient is a 54 y.o. year-old w/ no sig PMH presents to ED w/ c/o shortness of breath. 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 Not immunized for COVID-19. Reports 4d hx of cough, chills, fevers, body aches, headache and shortness of breath. No hx of heart disease, HTN, clotting disorder, kidney disease. In ED w/u showed +COVID test and high inflammatory markers (^d-dimer, fibrinogen, ldh). CXR shows "nonspecific asymmetric perihilar opacity". Sats in ED not documented were in the high 80's on RA and are 95- 97% on 3-4 L [place]. Asked to see for admission.    Pt states in addition to above, headaches have been particularly difficult, he takes shallow breaths and tries to avoid coughing to keep the headaches from getting worse.  Otherwise healthy, no prior sig hospitalization. Alcohol dependence listed in problem list, pt denies etoh dependence, drinks "maybe 2 beers a day", denies hx DT's.   No appetite, no n/v/d or abd pain but just can't eat very much.     ROS  denies CP  no joint pain   no HA  no blurry vision  no rash  no dysuria  no difficulty voiding  no change in urine color   Past Medical History History reviewed. No pertinent past medical history. Past Surgical History History reviewed. No pertinent surgical history. Family History  Family History  Problem Relation Age of Onset  . Diabetes Mother    Social History  reports that he has never smoked. He has never used smokeless tobacco. He reports current alcohol use of about 2.0 standard drinks of alcohol per week. He reports previous drug use. Allergies No Known Allergies Home medications Prior to Admission medications   Medication Sig Start Date End Date Taking? Authorizing Provider  amoxicillin-clavulanate (AUGMENTIN) 875-125 MG tablet Take 1 tablet by mouth every 12 (twelve) hours. Patient not taking:  Reported on 02/04/2020 03/01/18   [name], MD       Exam Gen alert, no distress, nasal O2 No rash, cyanosis or gangrene Sclera anicteric, throat clear  No jvd or bruits Chest scattered rhonchi, no rales or wheezing RRR no MRG Abd soft ntnd no mass or ascites +bs GU normal male MS no joint effusions or deformity Ext no LE edema, no wounds or ulcers Neuro is alert, Ox 3 , nf    Home meds:  - augmentin bid     BP 133/90, 127/ 85, HR 102 > 83  RR 31- 40  Temp 103 >> 100.3  CXR - IMPRESSION: Low lung volumes with nonspecific asymmetric perihilar opacity. Top considerations in this setting are atelectasis and viral/atypical respiratory infection.  Assessment/ Plan: 1. COVID + infection/ PNA - sick for 4 days at home. Not vaccinated. +infiltrates on CXR and hypoxemia on 4 L [place] O2. No sig comorbidities, not on any Rx meds at home.  Plan IV steroids and remdesivir, daily covid labs, O2 support, gentle IVF's x 24 hrs. Pain meds for HA control.      [name]  MD 02/04/2020, 5:09 PM

## 2020-02-04 NOTE — ED Provider Notes (Signed)
[facility] EMERGENCY DEPARTMENT Provider Note   CSN: [id number] Arrival date & time: 02/04/20  1250     History Chief Complaint  Patient presents with  . Shortness of Breath    Bill Whitehead is a 54 y.o. male with no relevant past medical history presents the ED with complaints of shortness of breath.  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  He is not immunized for COVID-19.  He reports that approximately 4 days ago he developed acute onset cough, shortness of breath, fevers, chills, body aches, and headache.  He also states that he has diminished appetite with early satiety.  He denies any significant past medical history, history of clots, disorder, recent unilateral extremity swelling or edema, chest pain, or other symptoms.  He does have pleuritic symptoms as he coughs whenever he takes a deep breath.  HPI     History reviewed. No pertinent past medical history.  Patient Active Problem List   Diagnosis Date Noted  . Alcohol dependence (HCC) 03/01/2018  . Hyponatremia 03/01/2018  . Dental abscess 02/28/2018    History reviewed. No pertinent surgical history.     Family History  Problem Relation Age of Onset  . Diabetes Mother     Social History   Tobacco Use  . Smoking status: Never Smoker  . Smokeless tobacco: Never Used  Substance Use Topics  . Alcohol use: Yes    Alcohol/week: 2.0 standard drinks    Types: 2 Cans of beer per week  . Drug use: Not Currently    Home Medications Prior to Admission medications   Medication Sig Start Date End Date Taking? Authorizing Provider  amoxicillin-clavulanate (AUGMENTIN) 875-125 MG tablet Take 1 tablet by mouth every 12 (twelve) hours. Patient not taking: Reported on 02/04/2020 03/01/18   [name], MD    Allergies    Patient has no known allergies.  Review of Systems   Review of Systems  All other systems reviewed and are negative.   Physical Exam Updated Vital Signs BP 127/85   Pulse  84   Temp 100.3 F (37.9 C) (Oral)   Resp (!) 31   SpO2 97%   Physical Exam Vitals and nursing note reviewed. Exam conducted with a chaperone present.  Constitutional:      Appearance: He is ill-appearing.  HENT:     Head: Normocephalic and atraumatic.  Eyes:     General: No scleral icterus.    Conjunctiva/sclera: Conjunctivae normal.  Cardiovascular:     Rate and Rhythm: Regular rhythm. Tachycardia present.     Pulses: Normal pulses.     Heart sounds: Normal heart sounds.  Pulmonary:     Comments: Increased work of breathing.  Tachypnea to 40.  Currently on 4 L supplemental O2 via [place].  Rales noted in lower lobes bilaterally.  No accessory muscle use. Musculoskeletal:     Cervical back: Normal range of motion. No rigidity.     Right lower leg: No edema.     Left lower leg: No edema.  Skin:    General: Skin is dry.     Capillary Refill: Capillary refill takes less than 2 seconds.  Neurological:     Mental Status: He is alert and oriented to person, place, and time.     GCS: GCS eye subscore is 4. GCS verbal subscore is 5. GCS motor subscore is 6.  Psychiatric:        Mood and Affect: Mood normal.  Behavior: Behavior normal.        Thought Content: Thought content normal.     ED Results / Procedures / Treatments   Labs (all labs ordered are listed, but only abnormal results are displayed) Labs Reviewed  RESPIRATORY PANEL BY RT PCR (FLU A&B, COVID) - Abnormal; Notable for the following components:      Result Value   SARS Coronavirus 2 by RT PCR POSITIVE (*)    All other components within normal limits  LACTIC ACID, PLASMA - Abnormal; Notable for the following components:   Lactic Acid, Venous 2.1 (*)    All other components within normal limits  COMPREHENSIVE METABOLIC PANEL - Abnormal; Notable for the following components:   Sodium 134 (*)    Chloride 97 (*)    Glucose, Bld 106 (*)    Creatinine, Ser 1.31 (*)    Total Protein 8.6 (*)    AST 45 (*)    ALT  45 (*)    Total Bilirubin 1.3 (*)    All other components within normal limits  D-DIMER, QUANTITATIVE (NOT AT [facility]) - Abnormal; Notable for the following components:   D-Dimer, Quant 1.75 (*)    All other components within normal limits  LACTATE DEHYDROGENASE - Abnormal; Notable for the following components:   LDH 302 (*)    All other components within normal limits  FIBRINOGEN - Abnormal; Notable for the following components:   Fibrinogen >800 (*)    All other components within normal limits  CULTURE, BLOOD (ROUTINE X 2)  CULTURE, BLOOD (ROUTINE X 2)  PROCALCITONIN  TRIGLYCERIDES  LACTIC ACID, PLASMA  CBC WITH DIFFERENTIAL/PLATELET  FERRITIN  C-REACTIVE PROTEIN  CBC WITH DIFFERENTIAL/PLATELET    EKG None  Radiology DG Chest Port 1 View  Result Date: 02/04/2020 CLINICAL DATA:  54 year old male with shortness of breath, hypoxia, COVID-19. Status pending. EXAM: PORTABLE CHEST 1 VIEW COMPARISON:  Neck CT 02/28/2018. FINDINGS: Portable AP upright view at 1520 hours. Low lung volumes. Mediastinal contours are within normal limits. Visualized tracheal air column is within normal limits. Streaky asymmetric perihilar pulmonary opacity. No pneumothorax, pulmonary edema or pleural effusion. Negative visible bowel gas pattern and osseous structures. IMPRESSION: Low lung volumes with nonspecific asymmetric perihilar opacity. Top considerations in this setting are atelectasis and viral/atypical respiratory infection. Electronically Signed   By: Odessa Fleming M.D.   On: 02/04/2020 15:44    Procedures .Critical Care Performed by: Lorelee New, PA-C Authorized by: Lorelee New, PA-C   Critical care provider statement:    Critical care time (minutes):  45   Critical care was necessary to treat or prevent imminent or life-threatening deterioration of the following conditions:  Respiratory failure   Critical care was time spent personally by me on the following activities:  Discussions with  consultants, evaluation of patient's response to treatment, examination of patient, ordering and performing treatments and interventions, ordering and review of laboratory studies, ordering and review of radiographic studies, pulse oximetry, re-evaluation of patient's condition, obtaining history from patient or surrogate and review of old charts Comments:     Hypoxia in setting of COVID-19 pneumonia.   (including critical care time)  Medications Ordered in ED Medications  dexamethasone (DECADRON) injection 8 mg (has no administration in time range)  acetaminophen (TYLENOL) tablet 650 mg (650 mg Oral Given 02/04/20 1445)    ED Course  I have reviewed the triage vital signs and the nursing notes.  Pertinent labs & imaging results that were available during my  care of the patient were reviewed by me and considered in my medical decision making (see chart for details).  Clinical Course as of Feb 03 1718  Mon Feb 04, 2020  1453 EKG: Sinus  Rate 97 LVH No ischemic changes No old to compare   [CS]  1658 Spoke with Dr. Arlean Hopping who will see and admit patient.   [GG]    Clinical Course User Index [CS] Pollyann Savoy, MD [GG] Lorelee New, PA-C   MDM Rules/Calculators/A&P                          Patient's history, physical exam, and hypoxia on arrival is concerning for possible COVID-19 infection.  Patient is not immunized.  He was placed on 3 L supplemental O2 via [place].  He remains tachypneic, but is now oxygenating well in the mid-90s.  Patient denies any chest pain or history of clots/clotting disorder.  Will obtain suspected COVID-19 preadmission labs and reevaluate.  Patient is positive for COVID-19.  Laboratory work-up is also suggestive of COVID-19 infection with elevated inflammatory markers including LDH and fibrinogen.  D-dimer mildly elevated at 1.75.  He denies any history of clots or clotting disorder.  No chest pain.  Will order decadron 8 mg.  Spoke with Dr.  Arlean Hopping who will see and admit patient.  HUNTLEY KNOOP was evaluated in Emergency Department on 02/04/2020 for the symptoms described in the history of present illness. He was evaluated in the context of the global COVID-19 pandemic, which necessitated consideration that the patient might be at risk for infection with the SARS-CoV-2 virus that causes COVID-19. Institutional protocols and algorithms that pertain to the evaluation of patients at risk for COVID-19 are in a state of rapid change based on information released by regulatory bodies including the CDC and federal and state organizations. These policies and algorithms were followed during the patient's care in the ED.   Final Clinical Impression(s) / ED Diagnoses Final diagnoses:  Pneumonia due to COVID-19 virus  Hypoxia    Rx / DC Orders ED Discharge Orders    None       Lorelee New, PA-C 02/04/20 1719    Pollyann Savoy, MD 02/05/20 [phone]

## 2020-02-04 NOTE — ED Notes (Signed)
CRITICAL VALUE ALERT  Critical Value: covid +  Date & Time Notied: 1647  Provider Notified: green  Orders Received/Actions taken:

## 2020-02-04 NOTE — ED Notes (Signed)
Waiting on pharmacy to verify remdesivir

## 2020-02-04 NOTE — ED Triage Notes (Signed)
Short of breath, unable to eat, fever

## 2020-02-04 NOTE — ED Notes (Signed)
Waiting on pharmacy to

## 2020-02-05 DIAGNOSIS — U071 COVID-19: Secondary | ICD-10-CM | POA: Diagnosis present

## 2020-02-05 DIAGNOSIS — J9601 Acute respiratory failure with hypoxia: Secondary | ICD-10-CM | POA: Diagnosis present

## 2020-02-05 DIAGNOSIS — J1282 Pneumonia due to coronavirus disease 2019: Secondary | ICD-10-CM

## 2020-02-05 LAB — HIV ANTIBODY (ROUTINE TESTING W REFLEX): HIV Screen 4th Generation wRfx: NONREACTIVE

## 2020-02-05 LAB — CBC WITH DIFFERENTIAL/PLATELET
Abs Immature Granulocytes: 0.09 10*3/uL — ABNORMAL HIGH (ref 0.00–0.07)
Basophils Absolute: 0 10*3/uL (ref 0.0–0.1)
Basophils Relative: 0 %
Eosinophils Absolute: 0 10*3/uL (ref 0.0–0.5)
Eosinophils Relative: 0 %
HCT: 38 % — ABNORMAL LOW (ref 39.0–52.0)
Hemoglobin: 12.5 g/dL — ABNORMAL LOW (ref 13.0–17.0)
Immature Granulocytes: 1 %
Lymphocytes Relative: 11 %
Lymphs Abs: 1.5 10*3/uL (ref 0.7–4.0)
MCH: 28.3 pg (ref 26.0–34.0)
MCHC: 32.9 g/dL (ref 30.0–36.0)
MCV: 86.2 fL (ref 80.0–100.0)
Monocytes Absolute: 0.4 10*3/uL (ref 0.1–1.0)
Monocytes Relative: 3 %
Neutro Abs: 11.1 10*3/uL — ABNORMAL HIGH (ref 1.7–7.7)
Neutrophils Relative %: 85 %
Platelets: 226 10*3/uL (ref 150–400)
RBC: 4.41 MIL/uL (ref 4.22–5.81)
RDW: 13.8 % (ref 11.5–15.5)
WBC: 13.2 10*3/uL — ABNORMAL HIGH (ref 4.0–10.5)
nRBC: 0 % (ref 0.0–0.2)

## 2020-02-05 LAB — BLOOD CULTURE ID PANEL (REFLEXED) - BCID2

## 2020-02-05 LAB — C-REACTIVE PROTEIN: CRP: 18.8 mg/dL — ABNORMAL HIGH (ref <1.0)

## 2020-02-05 LAB — COMPREHENSIVE METABOLIC PANEL
ALT: 44 U/L (ref 0–44)
AST: 38 U/L (ref 15–41)
Albumin: 3.3 g/dL — ABNORMAL LOW (ref 3.5–5.0)
Alkaline Phosphatase: 42 U/L (ref 38–126)
Anion gap: 11 (ref 5–15)
BUN: 16 mg/dL (ref 6–20)
CO2: 24 mmol/L (ref 22–32)
Calcium: 8.7 mg/dL — ABNORMAL LOW (ref 8.9–10.3)
Chloride: 99 mmol/L (ref 98–111)
Creatinine, Ser: 0.98 mg/dL (ref 0.61–1.24)
GFR, Estimated: >60 mL/min (ref >60)
Glucose, Bld: 107 mg/dL — ABNORMAL HIGH (ref 70–99)
Potassium: 3.8 mmol/L (ref 3.5–5.1)
Sodium: 134 mmol/L — ABNORMAL LOW (ref 135–145)
Total Bilirubin: 1 mg/dL (ref 0.3–1.2)
Total Protein: 7.7 g/dL (ref 6.5–8.1)

## 2020-02-05 LAB — D-DIMER, QUANTITATIVE: D-Dimer, Quant: 1.69 ug/mL-FEU — ABNORMAL HIGH (ref 0.00–0.50)

## 2020-02-05 LAB — GLUCOSE, CAPILLARY: Glucose-Capillary: 142 mg/dL — ABNORMAL HIGH (ref 70–99)

## 2020-02-05 LAB — MAGNESIUM: Magnesium: 2.7 mg/dL — ABNORMAL HIGH (ref 1.7–2.4)

## 2020-02-05 LAB — FERRITIN: Ferritin: 688 ng/mL — ABNORMAL HIGH (ref 24–336)

## 2020-02-05 LAB — PHOSPHORUS: Phosphorus: 2.2 mg/dL — ABNORMAL LOW (ref 2.5–4.6)

## 2020-02-05 MED ORDER — ADULT MULTIVITAMIN W/MINERALS CH
1.0000 | ORAL_TABLET | Freq: Every day | ORAL | Status: DC
  Administered 2020-02-05 – 2020-02-07 (×3): 1 via ORAL
  Filled 2020-02-05 (×3): qty 1

## 2020-02-05 MED ORDER — FOLIC ACID 1 MG PO TABS
1.0000 mg | ORAL_TABLET | Freq: Every day | ORAL | Status: DC
  Administered 2020-02-05 – 2020-02-07 (×3): 1 mg via ORAL
  Filled 2020-02-05 (×3): qty 1

## 2020-02-05 MED ORDER — INSULIN ASPART 100 UNIT/ML ~~LOC~~ SOLN
0.0000 [IU] | Freq: Three times a day (TID) | SUBCUTANEOUS | Status: DC
  Administered 2020-02-06 – 2020-02-07 (×2): 1 [IU] via SUBCUTANEOUS

## 2020-02-05 MED ORDER — INFLUENZA VAC SPLIT QUAD 0.5 ML IM SUSY
0.5000 mL | PREFILLED_SYRINGE | INTRAMUSCULAR | Status: DC
  Filled 2020-02-05: qty 0.5

## 2020-02-05 MED ORDER — PANTOPRAZOLE SODIUM 40 MG PO TBEC
40.0000 mg | DELAYED_RELEASE_TABLET | Freq: Every day | ORAL | Status: DC
  Administered 2020-02-05 – 2020-02-07 (×3): 40 mg via ORAL
  Filled 2020-02-05 (×3): qty 1

## 2020-02-05 MED ORDER — ENOXAPARIN SODIUM 60 MG/0.6ML ~~LOC~~ SOLN
60.0000 mg | SUBCUTANEOUS | Status: DC
  Administered 2020-02-05 – 2020-02-06 (×2): 60 mg via SUBCUTANEOUS
  Filled 2020-02-05 (×2): qty 0.6

## 2020-02-05 MED ORDER — THIAMINE HCL 100 MG/ML IJ SOLN: 100.0000 mg | Freq: Every day | INTRAMUSCULAR | Status: DC

## 2020-02-05 MED ORDER — THIAMINE HCL 100 MG PO TABS
100.0000 mg | ORAL_TABLET | Freq: Every day | ORAL | Status: DC
  Administered 2020-02-05 – 2020-02-07 (×3): 100 mg via ORAL
  Filled 2020-02-05 (×3): qty 1

## 2020-02-05 MED ORDER — LORAZEPAM 2 MG/ML IJ SOLN: 1.0000 mg | INTRAMUSCULAR | Status: DC | PRN

## 2020-02-05 MED ORDER — INSULIN ASPART 100 UNIT/ML ~~LOC~~ SOLN: 0.0000 [IU] | Freq: Every day | SUBCUTANEOUS | Status: DC

## 2020-02-05 MED ORDER — HYDROCODONE-ACETAMINOPHEN 5-325 MG PO TABS: 1.0000 | ORAL_TABLET | ORAL | Status: DC | PRN

## 2020-02-05 MED ORDER — ALBUTEROL SULFATE HFA 108 (90 BASE) MCG/ACT IN AERS
2.0000 | INHALATION_SPRAY | Freq: Four times a day (QID) | RESPIRATORY_TRACT | Status: DC
  Administered 2020-02-05 – 2020-02-07 (×6): 2 via RESPIRATORY_TRACT
  Filled 2020-02-05: qty 6.7

## 2020-02-05 MED ORDER — LORAZEPAM 1 MG PO TABS: 1.0000 mg | ORAL_TABLET | ORAL | Status: DC | PRN

## 2020-02-05 NOTE — Progress Notes (Signed)
Patient Demographics:    Dimetrius Montfort, is a 54 y.o. male, DOB - 15-Jun-1965, OAC:[id number]  Admit date - 02/04/2020   Admitting Physician Delano Metz, MD  Outpatient Primary MD for the patient is Patient, No Pcp Per  LOS - 1   Chief Complaint  Patient presents with  . Shortness of Breath        Subjective:    Yossef Gilkison today has no fevers, no emesis,  No chest pain,   -Cough and shortness of breath persist -Currently requiring 3 L of oxygen via nasal cannula, patient desaturates very quickly with minimum activity  Assessment  & Plan :    Principal Problem:   Pneumonia due to COVID-19 virus Active Problems:   Acute respiratory failure with hypoxia (HCC)   Acute respiratory disease due to COVID-19 virus with Hypoxia  Brief Summary:- 54 year old obese man with history of alcohol abuse admitted on 02/04/2020 with acute hypoxic respiratory failure secondary to Covid pneumonia -Patient is Not vaccinated against COVID-19  A/p 1)Acute hypoxic respiratory failure secondary to COVID-19 infection/Pneumonia--- The treatment plan and use of medications  for treatment of COVID-19 infection and possible side effects were discussed with patient/family -----Patient/Family verbalizes understanding and agrees to treatment protocols  --Currently requiring 3 L of oxygen via nasal cannula, patient desaturates very quickly with minimum activity --Patient is positive for COVID-19 infection, chest x-ray with findings of infiltrates/opacities,  patient is tachypneic/hypoxic and requiring continuous supplemental oxygen---patient meets criteria for initiation of Remdesivir AND Steroid therapy per protocol  --Check and trend inflammatory markers including D-dimer, ferritin and  CRP---also follow CBC and CMP --Supplemental oxygen to keep O2 sats above 93% -Follow serial chest x-rays and ABGs as indicated --- Encourage  prone positioning for More than 16 hours/day in increments of 2 to 3 hours at a time if able to tolerate --Attempt to maintain euvolemic state --Zinc and vitamin C as ordered -Albuterol inhaler as needed -Accu-Cheks/fingersticks while on high-dose steroids -PPI while on high-dose steroids -Enhanced dosage of anticoagulant for DVT prophylaxis given hypercoagulable state with COVID-19 infections -Continue IV steroids and remdesivir started on 02/04/2020 COVID-19 Labs  Recent Labs    02/04/20 1439 02/05/20 0832 02/05/20 0833  DDIMER 1.75* 1.69*  --   FERRITIN 649*  --  688*  LDH 302*  --   --   CRP 15.7*  --  18.8*    Lab Results  Component Value Date   SARSCOV2NAA POSITIVE (A) 02/04/2020    2)H/o Etoh Abuse--denies excessive use of alcohol recently, lorazepam per CIWA protocol, folic acid and thiamine as ordered  3) Obesity- -this complicates overall care, increases morbidity and mortality -Body mass index is 32.02 kg/m.  4) blood cultures with staph epi------- suspect contaminant  Disposition/Need for in-Hospital Stay- patient unable to be discharged at this time due to --acute hypoxic respiratory failure secondary to Covid pneumonia requiring IV steroids and IV remdesivir as well as supplemental oxygen  Status is: Inpatient  Remains inpatient appropriate because:acute hypoxic respiratory failure secondary to Covid pneumonia requiring IV steroids and IV remdesivir as well as supplemental oxygen   Disposition: The patient is from: Home              Anticipated d/c is to: Home  Anticipated d/c date is: 1 day              Patient currently is not medically stable to d/c. Barriers: Not Clinically Stable- acute hypoxic respiratory failure secondary to Covid pneumonia requiring IV steroids and IV remdesivir as well as supplemental oxygen   Code Status : full  Family Communication:   (patient is alert, awake and coherent) *   Consults  :na   DVT Prophylaxis   :  Lovenox -   - SCDs    Lab Results  Component Value Date   PLT 226 02/05/2020    Inpatient Medications  Scheduled Meds: . vitamin C  500 mg Oral Daily  . enoxaparin (LOVENOX) injection  40 mg Subcutaneous Q24H  . folic acid  1 mg Oral Daily  . [START ON 02/06/2020] influenza vac split quadrivalent PF  0.5 mL Intramuscular Tomorrow-1000  . methylPREDNISolone (SOLU-MEDROL) injection  0.5 mg/kg Intravenous Q12H   Followed by  . [START ON 02/08/2020] predniSONE  50 mg Oral Daily  . multivitamin with minerals  1 tablet Oral Daily  . thiamine  100 mg Oral Daily   Or  . thiamine  100 mg Intravenous Daily  . zinc sulfate  220 mg Oral Daily   Continuous Infusions: . sodium chloride 50 mL/hr at 02/05/20 1741  . remdesivir 100 mg in NS 100 mL 100 mg (02/05/20 1038)   PRN Meds:.acetaminophen, chlorpheniramine-HYDROcodone, guaiFENesin-dextromethorphan, HYDROcodone-acetaminophen, LORazepam **OR** LORazepam, ondansetron **OR** ondansetron (ZOFRAN) IV    Anti-infectives (From admission, onward)   Start     Dose/Rate Route Frequency Ordered Stop   02/05/20 1000  remdesivir 100 mg in sodium chloride 0.9 % 100 mL IVPB  Status:  Discontinued       "Followed by" Linked Group Details   100 mg 200 mL/hr over 30 Minutes Intravenous Daily 02/04/20 2004 02/04/20 2022   02/05/20 1000  remdesivir 100 mg in sodium chloride 0.9 % 100 mL IVPB        100 mg 200 mL/hr over 30 Minutes Intravenous Daily 02/04/20 2022 02/09/20 0959   02/04/20 2100  remdesivir 100 mg in sodium chloride 0.9 % 100 mL IVPB       "Followed by" Linked Group Details   100 mg 200 mL/hr over 30 Minutes Intravenous  Once 02/04/20 2022 02/04/20 2152   02/04/20 2030  remdesivir 100 mg in sodium chloride 0.9 % 100 mL IVPB       "Followed by" Linked Group Details   100 mg 200 mL/hr over 30 Minutes Intravenous  Once 02/04/20 2022 02/04/20 2132   02/04/20 2015  remdesivir 200 mg in sodium chloride 0.9% 250 mL IVPB  Status:   Discontinued       "Followed by" Linked Group Details   200 mg 580 mL/hr over 30 Minutes Intravenous Once 02/04/20 2004 02/04/20 2022        Objective:   Vitals:   02/05/20 1333 02/05/20 1335 02/05/20 1337 02/05/20 1745  BP:    110/70  Pulse:    86  Resp: (!) 30 (!) 25 (!) 23 20  Temp:    98.9 F (37.2 C)  TempSrc:    Oral  SpO2: (!) 85% (!) 87% (!) 88% 94%  Weight:      Height:        Wt Readings from Last 3 Encounters:  02/04/20 99.8 kg  03/01/18 108.5 kg     Intake/Output Summary (Last 24 hours) at 02/05/2020 1946 Last data filed at 02/05/2020 1500  Gross per 24 hour  Intake 1570.05 ml  Output --  Net 1570.05 ml     Physical Exam  Gen:- Awake Alert, dyspnea with minimal activity HEENT:- [place].AT, No sclera icterus Nose- [place] 3L/min Neck-Supple Neck,No JVD,.  Lungs-diminished breath sounds with scattered rhonchi  CV- S1, S2 normal, regular  Abd-  +ve B.Sounds, Abd Soft, No tenderness,    Extremity/Skin:- No  edema, pedal pulses present  Psych-affect is appropriate, oriented x3 Neuro-no new focal deficits, no tremors   Data Review:   Micro Results Recent Results (from the past 240 hour(s))  Blood Culture (routine x 2)     Status: None (Preliminary result)   Collection Time: 02/04/20  2:39 PM   Specimen: Right Antecubital; Blood  Result Value Ref Range Status   Specimen Description   Final    RIGHT ANTECUBITAL BOTTLES DRAWN AEROBIC AND ANAEROBIC   Special Requests   Final    Blood Culture adequate volume Performed at [facility], [address] [place], [place] [address]    Culture PENDING  Incomplete   Report Status PENDING  Incomplete  Respiratory Panel by RT PCR (Flu A&B, Covid) - Nasopharyngeal Swab     Status: Abnormal   Collection Time: 02/04/20  2:44 PM   Specimen: Nasopharyngeal Swab  Result Value Ref Range Status   SARS Coronavirus 2 by RT PCR POSITIVE (A) NEGATIVE Final    Comment: CRITICAL RESULT CALLED TO, READ BACK BY AND VERIFIED  WITH: ROBIN GENTRY,RN @1640  02/04/2020 KAY (NOTE) SARS-CoV-2 target nucleic acids are DETECTED.  SARS-CoV-2 RNA is generally detectable in upper respiratory specimens  during the acute phase of infection. Positive results are indicative of the presence of the identified virus, but do not rule out bacterial infection or co-infection with other pathogens not detected by the test. Clinical correlation with patient history and other diagnostic information is necessary to determine patient infection status. The expected result is Negative.  Fact Sheet for Patients:  04/05/2020  Fact Sheet for Healthcare Providers: https://www.moore.com/  This test is not yet approved or cleared by the https://www.young.biz/ FDA and  has been authorized for detection and/or diagnosis of SARS-CoV-2 by FDA under an Emergency Use Authorization (EUA).  This EUA will remain in effect (meaning this t est can be used) for the duration of  the COVID-19 declaration under Section 564(b)(1) of the Act, 21 U.S.C. section 360bbb-3(b)(1), unless the authorization is terminated or revoked sooner.      Influenza A by PCR NEGATIVE NEGATIVE Final   Influenza B by PCR NEGATIVE NEGATIVE Final    Comment: (NOTE) The Xpert Xpress SARS-CoV-2/FLU/RSV assay is intended as an aid in  the diagnosis of influenza from Nasopharyngeal swab specimens and  should not be used as a sole basis for treatment. Nasal washings and  aspirates are unacceptable for Xpert Xpress SARS-CoV-2/FLU/RSV  testing.  Fact Sheet for Patients: [place]  Fact Sheet for Healthcare Providers: https://www.moore.com/  This test is not yet approved or cleared by the https://www.young.biz/ FDA and  has been authorized for detection and/or diagnosis of SARS-CoV-2 by  FDA under an Emergency Use Authorization (EUA). This EUA will remain  in effect (meaning this test can be  used) for the duration of the  Covid-19 declaration under Section 564(b)(1) of the Act, 21  U.S.C. section 360bbb-3(b)(1), unless the authorization is  terminated or revoked. Performed at [facility], [address] [place], [place]   Blood Culture (routine x 2)     Status:  None (Preliminary result)   Collection Time: 02/04/20  2:44 PM   Specimen: Left Antecubital; Blood  Result Value Ref Range Status   Specimen Description   Final    LEFT ANTECUBITAL BOTTLES DRAWN AEROBIC AND ANAEROBIC Performed at [facility], [address], [place], [place] [address]    Special Requests   Final    Blood Culture adequate volume Performed at [facility], [address] [place], [place] [address]    Culture  Setup Time   Final    GRAM POSITIVE COCCI ANAEROBIC BOTTLE Gram Stain Report Called to,Read Back By and Verified With: ALSTON,C@1400  BY MATTHEWS, B 10.12.2021    Culture PENDING  Incomplete   Report Status PENDING  Incomplete  Blood Culture ID Panel (Reflexed)     Status: Abnormal   Collection Time: 02/04/20  2:44 PM  Result Value Ref Range Status   Enterococcus faecalis NOT DETECTED NOT DETECTED Final   Enterococcus Faecium NOT DETECTED NOT DETECTED Final   Listeria monocytogenes NOT DETECTED NOT DETECTED Final   Staphylococcus species DETECTED (A) NOT DETECTED Final    Comment: CRITICAL RESULT CALLED TO, READ BACK BY AND VERIFIED WITH: A MOORE RN 02/05/20 AT 1800 SK    Staphylococcus aureus (BCID) NOT DETECTED NOT DETECTED Final   Staphylococcus epidermidis DETECTED (A) NOT DETECTED Final    Comment: Methicillin (oxacillin) resistant coagulase negative staphylococcus. Possible blood culture contaminant (unless isolated from more than one blood culture draw or clinical case suggests pathogenicity). No antibiotic treatment is indicated for blood  culture contaminants. CRITICAL RESULT CALLED TO, READ BACK BY AND VERIFIED WITH: A MOORE RN 02/05/20 AT 1800 SK    Staphylococcus  lugdunensis NOT DETECTED NOT DETECTED Final   Streptococcus species NOT DETECTED NOT DETECTED Final   Streptococcus agalactiae NOT DETECTED NOT DETECTED Final   Streptococcus pneumoniae NOT DETECTED NOT DETECTED Final   Streptococcus pyogenes NOT DETECTED NOT DETECTED Final   A.calcoaceticus-baumannii NOT DETECTED NOT DETECTED Final   Bacteroides fragilis NOT DETECTED NOT DETECTED Final   Enterobacterales NOT DETECTED NOT DETECTED Final   Enterobacter cloacae complex NOT DETECTED NOT DETECTED Final   Escherichia coli NOT DETECTED NOT DETECTED Final   Klebsiella aerogenes NOT DETECTED NOT DETECTED Final   Klebsiella oxytoca NOT DETECTED NOT DETECTED Final   Klebsiella pneumoniae NOT DETECTED NOT DETECTED Final   Proteus species NOT DETECTED NOT DETECTED Final   Salmonella species NOT DETECTED NOT DETECTED Final   Serratia marcescens NOT DETECTED NOT DETECTED Final   Haemophilus influenzae NOT DETECTED NOT DETECTED Final   Neisseria meningitidis NOT DETECTED NOT DETECTED Final   Pseudomonas aeruginosa NOT DETECTED NOT DETECTED Final   Stenotrophomonas maltophilia NOT DETECTED NOT DETECTED Final   Candida albicans NOT DETECTED NOT DETECTED Final   Candida auris NOT DETECTED NOT DETECTED Final   Candida glabrata NOT DETECTED NOT DETECTED Final   Candida krusei NOT DETECTED NOT DETECTED Final   Candida parapsilosis NOT DETECTED NOT DETECTED Final   Candida tropicalis NOT DETECTED NOT DETECTED Final   Cryptococcus neoformans/gattii NOT DETECTED NOT DETECTED Final   Methicillin resistance mecA/C DETECTED (A) NOT DETECTED Final    Comment: CRITICAL RESULT CALLED TO, READ BACK BY AND VERIFIED WITH: A MOORE RN 02/05/20 AT 1800 SK Performed at [facility], [address] [place], [place] [address]     Radiology Reports DG Chest [place] 1 View  Result Date: 02/04/2020 CLINICAL DATA:  54 year old male with shortness of breath, hypoxia, COVID-19. Status pending. EXAM: PORTABLE  CHEST 1  VIEW COMPARISON:  Neck CT 02/28/2018. FINDINGS: Portable AP upright view at 1520 hours. Low lung volumes. Mediastinal contours are within normal limits. Visualized tracheal air column is within normal limits. Streaky asymmetric perihilar pulmonary opacity. No pneumothorax, pulmonary edema or pleural effusion. Negative visible bowel gas pattern and osseous structures. IMPRESSION: Low lung volumes with nonspecific asymmetric perihilar opacity. Top considerations in this setting are atelectasis and viral/atypical respiratory infection. Electronically Signed   By: Odessa Fleming M.D.   On: 02/04/2020 15:44     CBC Recent Labs  Lab 02/04/20 1439 02/05/20 0832  WBC 10.0 13.2*  HGB 13.7 12.5*  HCT 42.2 38.0*  PLT 222 226  MCV 89.4 86.2  MCH 29.0 28.3  MCHC 32.5 32.9  RDW 13.9 13.8  LYMPHSABS 1.3 1.5  MONOABS 0.4 0.4  EOSABS 0.0 0.0  BASOSABS 0.0 0.0    Chemistries  Recent Labs  Lab 02/04/20 1439 02/05/20 0832  NA 134* 134*  K 4.0 3.8  CL 97* 99  CO2 23 24  GLUCOSE 106* 107*  BUN 15 16  CREATININE 1.31* 0.98  CALCIUM 9.0 8.7*  MG  --  2.7*  AST 45* 38  ALT 45* 44  ALKPHOS 46 42  BILITOT 1.3* 1.0   ------------------------------------------------------------------------------------------------------------------ Recent Labs    02/04/20 1439  TRIG 70    Lab Results  Component Value Date   HGBA1C 5.9 (H) 02/28/2018   ------------------------------------------------------------------------------------------------------------------ No results for input(s): TSH, T4TOTAL, T3FREE, THYROIDAB in the last 72 hours.  Invalid input(s): FREET3 ------------------------------------------------------------------------------------------------------------------ Recent Labs    02/04/20 1439 02/05/20 0833  FERRITIN 649* 688*    Coagulation profile No results for input(s): INR, PROTIME in the last 168 hours.  Recent Labs    02/04/20 1439 02/05/20 0832  DDIMER 1.75* 1.69*     Cardiac Enzymes No results for input(s): CKMB, TROPONINI, MYOGLOBIN in the last 168 hours.  Invalid input(s): CK ------------------------------------------------------------------------------------------------------------------ No results found for: BNP   Shon Hale M.D on 02/05/2020 at 7:46 PM  Go to [email or web address] - for contact info  [facility] - Office  [phone]

## 2020-02-05 NOTE — Progress Notes (Addendum)
Assumed care of pt at 1300. Pt condition stable. MD notified of BC resluts Continue  With plan of care

## 2020-02-05 NOTE — Progress Notes (Signed)
Patient ambulated to bathroom off oxygen. Walking back was 99% on room air. Then became very short of breath, dropped sats into high 70s, patient placed back on 2 L and recovered in a 5 minute period to 88-90%.

## 2020-02-05 NOTE — Progress Notes (Signed)
CRITICAL VALUE ALERT  Critical Value:  Positive blood cultures, anaerobic bottle gram positive cocci  Date & Time Notied:  02/05/20 1410  Provider Notified: Dr. Marisa Severin  Orders Received/Actions taken:

## 2020-02-06 DIAGNOSIS — J9601 Acute respiratory failure with hypoxia: Secondary | ICD-10-CM

## 2020-02-06 LAB — CBC WITH DIFFERENTIAL/PLATELET
Abs Immature Granulocytes: 0.06 10*3/uL (ref 0.00–0.07)
Basophils Absolute: 0 10*3/uL (ref 0.0–0.1)
Basophils Relative: 0 %
Eosinophils Absolute: 0 10*3/uL (ref 0.0–0.5)
Eosinophils Relative: 0 %
HCT: 40.9 % (ref 39.0–52.0)
Hemoglobin: 13.2 g/dL (ref 13.0–17.0)
Immature Granulocytes: 1 %
Lymphocytes Relative: 13 %
Lymphs Abs: 1.1 10*3/uL (ref 0.7–4.0)
MCH: 28 pg (ref 26.0–34.0)
MCHC: 32.3 g/dL (ref 30.0–36.0)
MCV: 86.7 fL (ref 80.0–100.0)
Monocytes Absolute: 0.2 10*3/uL (ref 0.1–1.0)
Monocytes Relative: 3 %
Neutro Abs: 7.3 10*3/uL (ref 1.7–7.7)
Neutrophils Relative %: 83 %
Platelets: 273 10*3/uL (ref 150–400)
RBC: 4.72 MIL/uL (ref 4.22–5.81)
RDW: 13.5 % (ref 11.5–15.5)
WBC: 8.6 10*3/uL (ref 4.0–10.5)
nRBC: 0 % (ref 0.0–0.2)

## 2020-02-06 LAB — D-DIMER, QUANTITATIVE: D-Dimer, Quant: 1.56 ug/mL-FEU — ABNORMAL HIGH (ref 0.00–0.50)

## 2020-02-06 LAB — COMPREHENSIVE METABOLIC PANEL
ALT: 47 U/L — ABNORMAL HIGH (ref 0–44)
AST: 33 U/L (ref 15–41)
Albumin: 3.2 g/dL — ABNORMAL LOW (ref 3.5–5.0)
Alkaline Phosphatase: 47 U/L (ref 38–126)
Anion gap: 9 (ref 5–15)
BUN: 19 mg/dL (ref 6–20)
CO2: 25 mmol/L (ref 22–32)
Calcium: 8.8 mg/dL — ABNORMAL LOW (ref 8.9–10.3)
Chloride: 100 mmol/L (ref 98–111)
Creatinine, Ser: 0.88 mg/dL (ref 0.61–1.24)
GFR, Estimated: >60 mL/min (ref >60)
Glucose, Bld: 130 mg/dL — ABNORMAL HIGH (ref 70–99)
Potassium: 4.3 mmol/L (ref 3.5–5.1)
Sodium: 134 mmol/L — ABNORMAL LOW (ref 135–145)
Total Bilirubin: 0.5 mg/dL (ref 0.3–1.2)
Total Protein: 7.5 g/dL (ref 6.5–8.1)

## 2020-02-06 LAB — C-REACTIVE PROTEIN: CRP: 13.9 mg/dL — ABNORMAL HIGH (ref <1.0)

## 2020-02-06 LAB — PHOSPHORUS: Phosphorus: 2.9 mg/dL (ref 2.5–4.6)

## 2020-02-06 LAB — GLUCOSE, CAPILLARY
Glucose-Capillary: 126 mg/dL — ABNORMAL HIGH (ref 70–99)
Glucose-Capillary: 132 mg/dL — ABNORMAL HIGH (ref 70–99)
Glucose-Capillary: 154 mg/dL — ABNORMAL HIGH (ref 70–99)
Glucose-Capillary: 164 mg/dL — ABNORMAL HIGH (ref 70–99)

## 2020-02-06 LAB — MAGNESIUM: Magnesium: 2.9 mg/dL — ABNORMAL HIGH (ref 1.7–2.4)

## 2020-02-06 LAB — FERRITIN: Ferritin: 934 ng/mL — ABNORMAL HIGH (ref 24–336)

## 2020-02-06 NOTE — Progress Notes (Signed)
Patient Demographics:    Bill Whitehead, is a 54 y.o. male, DOB - October 26, 1965, TOI:[id number]  Admit date - 02/04/2020   Admitting Physician Delano Metz, MD  Outpatient Primary MD for the patient is Patient, No Pcp Per  LOS - 2   Chief Complaint  Patient presents with  . Shortness of Breath        Subjective:    Bill Whitehead today has no fevers, no emesis,  No chest pain,   -Cough and shortness of breath persist and remains on 3L/min [place] supplemental oxygen   Assessment  & Plan :    Principal Problem:   Pneumonia due to COVID-19 virus Active Problems:   Acute respiratory failure with hypoxia (HCC)   Acute respiratory disease due to COVID-19 virus with Hypoxia  Brief Summary:- 54 year old obese man with history of alcohol abuse admitted on 02/04/2020 with acute hypoxic respiratory failure secondary to Covid pneumonia -Patient is Not vaccinated against COVID-19  A/p 1)Acute hypoxic respiratory failure secondary to COVID-19 infection/Pneumonia--- The treatment plan and use of medications  for treatment of COVID-19 infection and possible side effects were discussed with patient/family -----Patient/Family verbalizes understanding and agrees to treatment protocols  --Pt is consistently requiring 3 L of oxygen via nasal cannula, patient desaturates very quickly with minimum activity --Patient is positive for COVID-19 infection, chest x-ray with findings of infiltrates/opacities,  patient is tachypneic/hypoxic and requiring continuous supplemental oxygen---patient meets criteria for initiation of Remdesivir AND Steroid therapy per protocol  --Check and trend inflammatory markers including D-dimer, ferritin and  CRP---also follow CBC and CMP --Supplemental oxygen to keep O2 sats above 93% -Follow serial chest x-rays and ABGs as indicated --- Encourage prone positioning for More than 16 hours/day in  increments of 2 to 3 hours at a time if able to tolerate --Attempt to maintain euvolemic state --Zinc and vitamin C as ordered -Albuterol inhaler as needed -Accu-Cheks/fingersticks while on high-dose steroids -PPI while on high-dose steroids -Enhanced dosage of anticoagulant for DVT prophylaxis given hypercoagulable state with COVID-19 infections -Continue IV steroids and remdesivir started on 02/04/2020 COVID-19 Labs  Recent Labs    02/04/20 1439 02/05/20 0832 02/05/20 0833 02/06/20 0701  DDIMER 1.75* 1.69*  --  1.56*  FERRITIN 649*  --  688* 934*  LDH 302*  --   --   --   CRP 15.7*  --  18.8* 13.9*    Lab Results  Component Value Date   SARSCOV2NAA POSITIVE (A) 02/04/2020    2)H/o ETOH Abuse--denies excessive use of alcohol recently, lorazepam per CIWA protocol, folic acid and thiamine as ordered  3) Obesity- -this complicates overall care, increases morbidity and mortality -Body mass index is 32.02 kg/m.  4) blood cultures with staph epidermidis ------- due to skin contaminant  Disposition/Need for in-Hospital Stay- patient unable to be discharged at this time due to --acute hypoxic respiratory failure secondary to Covid pneumonia requiring IV steroids and IV remdesivir as well as supplemental oxygen  Status is: Inpatient  Remains inpatient appropriate because:acute hypoxic respiratory failure secondary to Covid pneumonia requiring IV steroids and IV remdesivir as well as supplemental oxygen   Disposition: The patient is from: Home              Anticipated d/c  is to: Home              Anticipated d/c date is: 1 day  IF stable, plan DC 10/14.                Patient currently is not medically stable to d/c. Barriers: Not Clinically Stable- acute hypoxic respiratory failure secondary to Covid pneumonia requiring IV steroids and IV remdesivir as well as supplemental oxygen   Code Status : full  Family Communication:   (patient is alert, awake and coherent) *    Consults  :na   DVT Prophylaxis  :  Lovenox -   - SCDs    Lab Results  Component Value Date   PLT 273 02/06/2020    Inpatient Medications  Scheduled Meds: . albuterol  2 puff Inhalation QID  . vitamin C  500 mg Oral Daily  . enoxaparin (LOVENOX) injection  60 mg Subcutaneous Q24H  . folic acid  1 mg Oral Daily  . influenza vac split quadrivalent PF  0.5 mL Intramuscular Tomorrow-1000  . insulin aspart  0-5 Units Subcutaneous QHS  . insulin aspart  0-6 Units Subcutaneous TID WC  . methylPREDNISolone (SOLU-MEDROL) injection  0.5 mg/kg Intravenous Q12H   Followed by  . [START ON 02/08/2020] predniSONE  50 mg Oral Daily  . multivitamin with minerals  1 tablet Oral Daily  . pantoprazole  40 mg Oral Daily  . thiamine  100 mg Oral Daily   Or  . thiamine  100 mg Intravenous Daily  . zinc sulfate  220 mg Oral Daily   Continuous Infusions: . remdesivir 100 mg in NS 100 mL 100 mg (02/06/20 0914)   PRN Meds:.acetaminophen, chlorpheniramine-HYDROcodone, guaiFENesin-dextromethorphan, LORazepam **OR** LORazepam, ondansetron **OR** ondansetron (ZOFRAN) IV    Anti-infectives (From admission, onward)   Start     Dose/Rate Route Frequency Ordered Stop   02/05/20 1000  remdesivir 100 mg in sodium chloride 0.9 % 100 mL IVPB  Status:  Discontinued       "Followed by" Linked Group Details   100 mg 200 mL/hr over 30 Minutes Intravenous Daily 02/04/20 2004 02/04/20 2022   02/05/20 1000  remdesivir 100 mg in sodium chloride 0.9 % 100 mL IVPB        100 mg 200 mL/hr over 30 Minutes Intravenous Daily 02/04/20 2022 02/09/20 0959   02/04/20 2100  remdesivir 100 mg in sodium chloride 0.9 % 100 mL IVPB       "Followed by" Linked Group Details   100 mg 200 mL/hr over 30 Minutes Intravenous  Once 02/04/20 2022 02/04/20 2152   02/04/20 2030  remdesivir 100 mg in sodium chloride 0.9 % 100 mL IVPB       "Followed by" Linked Group Details   100 mg 200 mL/hr over 30 Minutes Intravenous  Once  02/04/20 2022 02/04/20 2132   02/04/20 2015  remdesivir 200 mg in sodium chloride 0.9% 250 mL IVPB  Status:  Discontinued       "Followed by" Linked Group Details   200 mg 580 mL/hr over 30 Minutes Intravenous Once 02/04/20 2004 02/04/20 2022        Objective:   Vitals:   02/05/20 2120 02/06/20 0718 02/06/20 0736 02/06/20 1147  BP: 111/78  109/83   Pulse: 79  75   Resp:   18   Temp: 98.8 F (37.1 C)  98.5 F (36.9 C)   TempSrc: Oral  Oral   SpO2: 94% 91% 92% 93%  Weight:  Height:        Wt Readings from Last 3 Encounters:  02/04/20 99.8 kg  03/01/18 108.5 kg     Intake/Output Summary (Last 24 hours) at 02/06/2020 1200 Last data filed at 02/05/2020 1500 Gross per 24 hour  Intake 906.72 ml  Output --  Net 906.72 ml   Physical Exam  Gen:- Awake Alert, NAD, appears comfortable.  HEENT:- [place]/AT, No sclera icterus Nose- [place] 3L/min Neck-Supple Neck,No JVD.  Lungs-no increased work of breathing no rhonchi heard.  CV- S1, S2 normal, regular  Abd-  +ve B.Sounds, Abd Soft, No tenderness,    Extremity/Skin:- No  edema, pedal pulses present  Psych-affect is appropriate, oriented x3 Neuro-no new focal deficits, no tremors   Data Review:   Micro Results Recent Results (from the past 240 hour(s))  Blood Culture (routine x 2)     Status: None (Preliminary result)   Collection Time: 02/04/20  2:39 PM   Specimen: Right Antecubital; Blood  Result Value Ref Range Status   Specimen Description   Final    RIGHT ANTECUBITAL BOTTLES DRAWN AEROBIC AND ANAEROBIC   Special Requests Blood Culture adequate volume  Final   Culture   Final    NO GROWTH 2 DAYS Performed at [facility], [address] [place], [place] [address]    Report Status PENDING  Incomplete  Respiratory Panel by RT PCR (Flu A&B, Covid) - Nasopharyngeal Swab     Status: Abnormal   Collection Time: 02/04/20  2:44 PM   Specimen: Nasopharyngeal Swab  Result Value Ref Range Status   SARS Coronavirus 2 by RT  PCR POSITIVE (A) NEGATIVE Final    Comment: CRITICAL RESULT CALLED TO, READ BACK BY AND VERIFIED WITH: ROBIN GENTRY,RN  02/04/2020 KAY (NOTE) SARS-CoV-2 target nucleic acids are DETECTED.  SARS-CoV-2 RNA is generally detectable in upper respiratory specimens  during the acute phase of infection. Positive results are indicative of the presence of the identified virus, but do not rule out bacterial infection or co-infection with other pathogens not detected by the test. Clinical correlation with patient history and other diagnostic information is necessary to determine patient infection status. The expected result is Negative.  Fact Sheet for Patients:  https://www.moore.com/  Fact Sheet for Healthcare Providers: https://www.young.biz/  This test is not yet approved or cleared by the [place] FDA and  has been authorized for detection and/or diagnosis of SARS-CoV-2 by FDA under an Emergency Use Authorization (EUA).  This EUA will remain in effect (meaning this t est can be used) for the duration of  the COVID-19 declaration under Section 564(b)(1) of the Act, 21 U.S.C. section 360bbb-3(b)(1), unless the authorization is terminated or revoked sooner.      Influenza A by PCR NEGATIVE NEGATIVE Final   Influenza B by PCR NEGATIVE NEGATIVE Final    Comment: (NOTE) The Xpert Xpress SARS-CoV-2/FLU/RSV assay is intended as an aid in  the diagnosis of influenza from Nasopharyngeal swab specimens and  should not be used as a sole basis for treatment. Nasal washings and  aspirates are unacceptable for Xpert Xpress SARS-CoV-2/FLU/RSV  testing.  Fact Sheet for Patients: https://www.moore.com/  Fact Sheet for Healthcare Providers: https://www.young.biz/  This test is not yet approved or cleared by the [place] FDA and  has been authorized for detection and/or diagnosis of SARS-CoV-2 by  FDA under  an Emergency Use Authorization (EUA). This EUA will remain  in effect (meaning this test can be used) for the duration of the  Covid-19  declaration under Section 564(b)(1) of the Act, 21  U.S.C. section 360bbb-3(b)(1), unless the authorization is  terminated or revoked. Performed at [facility], [address] [place], [place] [address]   Blood Culture (routine x 2)     Status: Abnormal (Preliminary result)   Collection Time: 02/04/20  2:44 PM   Specimen: Left Antecubital; Blood  Result Value Ref Range Status   Specimen Description   Final    LEFT ANTECUBITAL BOTTLES DRAWN AEROBIC AND ANAEROBIC Performed at [facility], [address] [place], [place] [address]    Special Requests   Final    Blood Culture adequate volume Performed at [facility], [address] [place], [place] [address]    Culture  Setup Time   Final    GRAM POSITIVE COCCI ANAEROBIC BOTTLE Gram Stain Report Called to,Read Back By and Verified With: ALSTON,C@1400  BY MATTHEWS, B 10.12.2021    Culture (A)  Final    STAPHYLOCOCCUS EPIDERMIDIS THE SIGNIFICANCE OF ISOLATING THIS ORGANISM FROM A SINGLE SET OF BLOOD CULTURES WHEN MULTIPLE SETS ARE DRAWN IS UNCERTAIN. PLEASE NOTIFY THE MICROBIOLOGY DEPARTMENT WITHIN ONE WEEK IF SPECIATION AND SENSITIVITIES ARE REQUIRED. Performed at [facility], [address] [place], [place] [address]    Report Status PENDING  Incomplete  Blood Culture ID Panel (Reflexed)     Status: Abnormal   Collection Time: 02/04/20  2:44 PM  Result Value Ref Range Status   Enterococcus faecalis NOT DETECTED NOT DETECTED Final   Enterococcus Faecium NOT DETECTED NOT DETECTED Final   Listeria monocytogenes NOT DETECTED NOT DETECTED Final   Staphylococcus species DETECTED (A) NOT DETECTED Final    Comment: CRITICAL RESULT CALLED TO, READ BACK BY AND VERIFIED WITH: A MOORE RN 02/05/20 AT 1800 SK    Staphylococcus aureus (BCID) NOT DETECTED NOT DETECTED Final   Staphylococcus  epidermidis DETECTED (A) NOT DETECTED Final    Comment: Methicillin (oxacillin) resistant coagulase negative staphylococcus. Possible blood culture contaminant (unless isolated from more than one blood culture draw or clinical case suggests pathogenicity). No antibiotic treatment is indicated for blood  culture contaminants. CRITICAL RESULT CALLED TO, READ BACK BY AND VERIFIED WITH: A MOORE RN 02/05/20 AT 1800 SK    Staphylococcus lugdunensis NOT DETECTED NOT DETECTED Final   Streptococcus species NOT DETECTED NOT DETECTED Final   Streptococcus agalactiae NOT DETECTED NOT DETECTED Final   Streptococcus pneumoniae NOT DETECTED NOT DETECTED Final   Streptococcus pyogenes NOT DETECTED NOT DETECTED Final   A.calcoaceticus-baumannii NOT DETECTED NOT DETECTED Final   Bacteroides fragilis NOT DETECTED NOT DETECTED Final   Enterobacterales NOT DETECTED NOT DETECTED Final   Enterobacter cloacae complex NOT DETECTED NOT DETECTED Final   Escherichia coli NOT DETECTED NOT DETECTED Final   Klebsiella aerogenes NOT DETECTED NOT DETECTED Final   Klebsiella oxytoca NOT DETECTED NOT DETECTED Final   Klebsiella pneumoniae NOT DETECTED NOT DETECTED Final   Proteus species NOT DETECTED NOT DETECTED Final   Salmonella species NOT DETECTED NOT DETECTED Final   Serratia marcescens NOT DETECTED NOT DETECTED Final   Haemophilus influenzae NOT DETECTED NOT DETECTED Final   Neisseria meningitidis NOT DETECTED NOT DETECTED Final   Pseudomonas aeruginosa NOT DETECTED NOT DETECTED Final   Stenotrophomonas maltophilia NOT DETECTED NOT DETECTED Final   Candida albicans NOT DETECTED NOT DETECTED Final   Candida auris NOT DETECTED NOT DETECTED Final   Candida glabrata NOT DETECTED NOT DETECTED Final   Candida krusei NOT DETECTED NOT DETECTED Final   Candida parapsilosis NOT  DETECTED NOT DETECTED Final   Candida tropicalis NOT DETECTED NOT DETECTED Final   Cryptococcus neoformans/gattii NOT DETECTED NOT DETECTED Final    Methicillin resistance mecA/C DETECTED (A) NOT DETECTED Final    Comment: CRITICAL RESULT CALLED TO, READ BACK BY AND VERIFIED WITH: A MOORE RN 02/05/20 AT 1800 SK Performed at [facility], [address] [place], [place] [address]     Radiology Reports DG Chest [place] 1 View  Result Date: 02/04/2020 CLINICAL DATA:  54 year old male with shortness of breath, hypoxia, COVID-19. Status pending. EXAM: PORTABLE CHEST 1 VIEW COMPARISON:  Neck CT 02/28/2018. FINDINGS: Portable AP upright view at 1520 hours. Low lung volumes. Mediastinal contours are within normal limits. Visualized tracheal air column is within normal limits. Streaky asymmetric perihilar pulmonary opacity. No pneumothorax, pulmonary edema or pleural effusion. Negative visible bowel gas pattern and osseous structures. IMPRESSION: Low lung volumes with nonspecific asymmetric perihilar opacity. Top considerations in this setting are atelectasis and viral/atypical respiratory infection. Electronically Signed   By: Odessa Fleming M.D.   On: 02/04/2020 15:44     CBC Recent Labs  Lab 02/04/20 1439 02/05/20 0832 02/06/20 0701  WBC 10.0 13.2* 8.6  HGB 13.7 12.5* 13.2  HCT 42.2 38.0* 40.9  PLT 222 226 273  MCV 89.4 86.2 86.7  MCH 29.0 28.3 28.0  MCHC 32.5 32.9 32.3  RDW 13.9 13.8 13.5  LYMPHSABS 1.3 1.5 1.1  MONOABS 0.4 0.4 0.2  EOSABS 0.0 0.0 0.0  BASOSABS 0.0 0.0 0.0    Chemistries  Recent Labs  Lab 02/04/20 1439 02/05/20 0832 02/06/20 0701  NA 134* 134* 134*  K 4.0 3.8 4.3  CL 97* 99 100  CO2 23 24 25   GLUCOSE 106* 107* 130*  BUN 15 16 19   CREATININE 1.31* 0.98 0.88  CALCIUM 9.0 8.7* 8.8*  MG  --  2.7* 2.9*  AST 45* 38 33  ALT 45* 44 47*  ALKPHOS 46 42 47  BILITOT 1.3* 1.0 0.5   ------------------------------------------------------------------------------------------------------------------ Recent Labs    02/04/20 1439  TRIG 70    Lab Results  Component Value Date   HGBA1C 5.9 (H) 02/28/2018    ------------------------------------------------------------------------------------------------------------------ No results for input(s): TSH, T4TOTAL, T3FREE, THYROIDAB in the last 72 hours.  Invalid input(s): FREET3 ------------------------------------------------------------------------------------------------------------------ Recent Labs    02/05/20 0833 02/06/20 0701  FERRITIN 688* 934*    Coagulation profile No results for input(s): INR, PROTIME in the last 168 hours.  Recent Labs    02/05/20 0832 02/06/20 0701  DDIMER 1.69* 1.56*    Cardiac Enzymes No results for input(s): CKMB, TROPONINI, MYOGLOBIN in the last 168 hours.  Invalid input(s): CK ------------------------------------------------------------------------------------------------------------------ No results found for: BNP   04/06/20 M.D on 02/06/2020 at 12:00 PM  Go to [email or web address] - for contact info  [facility] - Office  [phone]

## 2020-02-07 DIAGNOSIS — J069 Acute upper respiratory infection, unspecified: Secondary | ICD-10-CM

## 2020-02-07 LAB — CBC WITH DIFFERENTIAL/PLATELET
Abs Immature Granulocytes: 0.09 10*3/uL — ABNORMAL HIGH (ref 0.00–0.07)
Basophils Absolute: 0 10*3/uL (ref 0.0–0.1)
Basophils Relative: 0 %
Eosinophils Absolute: 0 10*3/uL (ref 0.0–0.5)
Eosinophils Relative: 0 %
HCT: 41.9 % (ref 39.0–52.0)
Hemoglobin: 13.6 g/dL (ref 13.0–17.0)
Immature Granulocytes: 1 %
Lymphocytes Relative: 15 %
Lymphs Abs: 1.5 10*3/uL (ref 0.7–4.0)
MCH: 28 pg (ref 26.0–34.0)
MCHC: 32.5 g/dL (ref 30.0–36.0)
MCV: 86.4 fL (ref 80.0–100.0)
Monocytes Absolute: 0.4 10*3/uL (ref 0.1–1.0)
Monocytes Relative: 3 %
Neutro Abs: 8.6 10*3/uL — ABNORMAL HIGH (ref 1.7–7.7)
Neutrophils Relative %: 81 %
Platelets: 305 10*3/uL (ref 150–400)
RBC: 4.85 MIL/uL (ref 4.22–5.81)
RDW: 13.6 % (ref 11.5–15.5)
WBC: 10.6 10*3/uL — ABNORMAL HIGH (ref 4.0–10.5)
nRBC: 0 % (ref 0.0–0.2)

## 2020-02-07 LAB — COMPREHENSIVE METABOLIC PANEL
ALT: 43 U/L (ref 0–44)
AST: 23 U/L (ref 15–41)
Albumin: 3.2 g/dL — ABNORMAL LOW (ref 3.5–5.0)
Alkaline Phosphatase: 46 U/L (ref 38–126)
Anion gap: 9 (ref 5–15)
BUN: 21 mg/dL — ABNORMAL HIGH (ref 6–20)
CO2: 24 mmol/L (ref 22–32)
Calcium: 8.9 mg/dL (ref 8.9–10.3)
Chloride: 101 mmol/L (ref 98–111)
Creatinine, Ser: 0.84 mg/dL (ref 0.61–1.24)
GFR, Estimated: >60 mL/min (ref >60)
Glucose, Bld: 115 mg/dL — ABNORMAL HIGH (ref 70–99)
Potassium: 4.1 mmol/L (ref 3.5–5.1)
Sodium: 134 mmol/L — ABNORMAL LOW (ref 135–145)
Total Bilirubin: 0.6 mg/dL (ref 0.3–1.2)
Total Protein: 7.6 g/dL (ref 6.5–8.1)

## 2020-02-07 LAB — GLUCOSE, CAPILLARY
Glucose-Capillary: 113 mg/dL — ABNORMAL HIGH (ref 70–99)
Glucose-Capillary: 177 mg/dL — ABNORMAL HIGH (ref 70–99)
Glucose-Capillary: 125 mg/dL — ABNORMAL HIGH (ref 70–99)

## 2020-02-07 LAB — FERRITIN: Ferritin: 846 ng/mL — ABNORMAL HIGH (ref 24–336)

## 2020-02-07 LAB — CULTURE, BLOOD (ROUTINE X 2): Special Requests: ADEQUATE

## 2020-02-07 LAB — MAGNESIUM: Magnesium: 2.7 mg/dL — ABNORMAL HIGH (ref 1.7–2.4)

## 2020-02-07 LAB — PHOSPHORUS: Phosphorus: 2.5 mg/dL (ref 2.5–4.6)

## 2020-02-07 LAB — D-DIMER, QUANTITATIVE: D-Dimer, Quant: 1.22 ug/mL-FEU — ABNORMAL HIGH (ref 0.00–0.50)

## 2020-02-07 LAB — C-REACTIVE PROTEIN: CRP: 7.3 mg/dL — ABNORMAL HIGH (ref <1.0)

## 2020-02-07 MED ORDER — ALBUTEROL SULFATE HFA 108 (90 BASE) MCG/ACT IN AERS: 2.0000 | INHALATION_SPRAY | Freq: Four times a day (QID) | RESPIRATORY_TRACT | 1 refills | Status: DC

## 2020-02-07 MED ORDER — GUAIFENESIN-DM 100-10 MG/5ML PO SYRP: 10.0000 mL | ORAL_SOLUTION | ORAL | 0 refills | Status: DC | PRN

## 2020-02-07 MED ORDER — DEXAMETHASONE 6 MG PO TABS: 6.0000 mg | ORAL_TABLET | Freq: Every day | ORAL | 0 refills | Status: AC

## 2020-02-07 MED ORDER — ACETAMINOPHEN 325 MG PO TABS: 650.0000 mg | ORAL_TABLET | Freq: Four times a day (QID) | ORAL | Status: DC | PRN

## 2020-02-07 MED ORDER — FOLIC ACID 1 MG PO TABS: 1.0000 mg | ORAL_TABLET | Freq: Every day | ORAL | 0 refills | Status: AC

## 2020-02-07 MED ORDER — ADULT MULTIVITAMIN W/MINERALS CH: 1.0000 | ORAL_TABLET | Freq: Every day | ORAL | Status: DC

## 2020-02-07 MED ORDER — ALBUTEROL SULFATE HFA 108 (90 BASE) MCG/ACT IN AERS: 2.0000 | INHALATION_SPRAY | Freq: Four times a day (QID) | RESPIRATORY_TRACT | Status: DC

## 2020-02-07 MED ORDER — OMEPRAZOLE MAGNESIUM 20 MG PO TBEC: 20.0000 mg | DELAYED_RELEASE_TABLET | Freq: Every day | ORAL | 0 refills | Status: DC

## 2020-02-07 MED ORDER — ASCORBIC ACID 500 MG PO TABS: 500.0000 mg | ORAL_TABLET | Freq: Every day | ORAL | 0 refills | Status: AC

## 2020-02-07 MED ORDER — THIAMINE HCL 100 MG PO TABS: 100.0000 mg | ORAL_TABLET | Freq: Every day | ORAL | 0 refills | Status: AC

## 2020-02-07 MED ORDER — ZINC SULFATE 220 (50 ZN) MG PO CAPS: 220.0000 mg | ORAL_CAPSULE | Freq: Every day | ORAL | 0 refills | Status: AC

## 2020-02-07 NOTE — Progress Notes (Signed)
Patient scheduled for outpatient Remdesivir infusions at 10am on Friday 10/15 at [facility]. Please inform the patient to park at [address] [place], [place], as staff will be escorting the patient through the east entrance of the hospital. Appointments take approximately 45 minutes.    There is a wave flag banner located near the entrance on [address]. Turn into this entrance and immediately turn left or right and park in 1 of the 10 designated Covid Infusion Parking spots. There is a phone number on the sign, please call and let the staff know what spot you are in and we will come out and get you. For questions call [phone].  Thanks.

## 2020-02-07 NOTE — Progress Notes (Signed)
SATURATION QUALIFICATIONS: (This note is used to comply with regulatory documentation for home oxygen)  Patient Saturations on Room Air at Rest = 80%  Patient Saturations on Room Air while Ambulating = 76% (only about 32ft of ambulation without O2)  Patient Saturations on 4 Liters of oxygen while Ambulating = 79%  Please briefly explain why patient needs home oxygen:  At rest with 3L O2 patient sats at 88%. At rest on 4L patient sats at 90%. Patient ambulated approximately 68ft with 4L O2 on and desaturated to 79%. Patient took approximately 6 minutes to recover to 90% O2 saturation with 4L O2 nasal cannula.

## 2020-02-07 NOTE — Discharge Instructions (Signed)
Patient scheduled for outpatient Remdesivir infusions at 10am on Friday 10/15 at [facility]. Please inform the patient to park at[address], [place], as staff will be escorting the patient through the east entrance of the hospital.Appointments take approximately 45 minutes.   There is a wave flag banner located near the entrance on [address]. Turn into this entranceand immediatelyturn left or right and park in 1 of the 10 designated Covid Infusion Parking spots. There is a phone number on the sign, please call and let the staff know what spot you are in and we will come out and get you. For questions call [phone]. Thanks.   10 Things You Can Do to Manage Your COVID-19 Symptoms at Home If you have possible or confirmed COVID-19: 1. Stay home from work and school. And stay away from other public places. If you must go out, avoid using any kind of public transportation, ridesharing, or taxis. 2. Monitor your symptoms carefully. If your symptoms get worse, call your healthcare provider immediately. 3. Get rest and stay hydrated. 4. If you have a medical appointment, call the healthcare provider ahead of time and tell them that you have or may have COVID-19. 5. For medical emergencies, call 911 and notify the dispatch personnel that you have or may have COVID-19. 6. Cover your cough and sneezes with a tissue or use the inside of your elbow. 7. Wash your hands often with soap and water for at least 20 seconds or clean your hands with an alcohol-based hand sanitizer that contains at least 60% alcohol. 8. As much as possible, stay in a specific room and away from other people in your home. Also, you should use a separate bathroom, if available. If you need to be around other people in or outside of the home, wear a mask. 9. Avoid sharing personal items with other people in your household, like dishes, towels, and bedding. 10. Clean all surfaces that are touched often, like  counters, tabletops, and doorknobs. Use household cleaning sprays or wipes according to the label instructions. SouthAmericaFlowers.co.uk 10/25/2018 This information is not intended to replace advice given to you by your health care provider. Make sure you discuss any questions you have with your health care provider. Document Revised: 03/29/2019 Document Reviewed: 03/29/2019 Elsevier Patient Education  2020 Elsevier Inc.  COVID-19: How to Protect Yourself and Others Know how it spreads  There is currently no vaccine to prevent coronavirus disease 2019 (COVID-19).  The best way to prevent illness is to avoid being exposed to this virus.  The virus is thought to spread mainly from person-to-person. ? Between people who are in close contact with one another (within about 6 feet). ? Through respiratory droplets produced when an infected person coughs, sneezes or talks. ? These droplets can land in the mouths or noses of people who are nearby or possibly be inhaled into the lungs. ? COVID-19 may be spread by people who are not showing symptoms. Everyone should Clean your hands often  Wash your hands often with soap and water for at least 20 seconds especially after you have been in a public place, or after blowing your nose, coughing, or sneezing.  If soap and water are not readily available, use a hand sanitizer that contains at least 60% alcohol. Cover all surfaces of your hands and rub them together until they feel dry.  Avoid touching your eyes, nose, and mouth with unwashed hands. Avoid close contact  Limit contact with others as  much as possible.  Avoid close contact with people who are sick.  Put distance between yourself and other people. ? Remember that some people without symptoms may be able to spread virus. ? This is especially important for people who are at higher risk of getting very  RetroStamps.it Cover your mouth and nose with a mask when around others  You could spread COVID-19 to others even if you do not feel sick.  Everyone should wear a mask in public settings and when around people not living in their household, especially when social distancing is difficult to maintain. ? Masks should not be placed on young children under age 26, anyone who has trouble breathing, or is unconscious, incapacitated or otherwise unable to remove the mask without assistance.  The mask is meant to protect other people in case you are infected.  Do NOT use a facemask meant for a Research scientist (physical sciences).  Continue to keep about 6 feet between yourself and others. The mask is not a substitute for social distancing. Cover coughs and sneezes  Always cover your mouth and nose with a tissue when you cough or sneeze or use the inside of your elbow.  Throw used tissues in the trash.  Immediately wash your hands with soap and water for at least 20 seconds. If soap and water are not readily available, clean your hands with a hand sanitizer that contains at least 60% alcohol. Clean and disinfect  Clean AND disinfect frequently touched surfaces daily. This includes tables, doorknobs, light switches, countertops, handles, desks, phones, keyboards, toilets, faucets, and sinks. ktimeonline.com  If surfaces are dirty, clean them: Use detergent or soap and water prior to disinfection.  Then, use a household disinfectant. You can see a list of EPA-registered household disinfectants here. SouthAmericaFlowers.co.uk 12/27/2018 This information is not intended to replace advice given to you by your health care provider. Make sure you discuss any questions you have with your health care provider. Document Revised: 01/04/2019 Document Reviewed: 11/02/2018 Elsevier Patient  Education  2020 [employer].   COVID-19 Frequently Asked Questions COVID-19 (coronavirus disease) is an infection that is caused by a large family of viruses. Some viruses cause illness in people and others cause illness in animals like camels, cats, and bats. In some cases, the viruses that cause illness in animals can spread to humans. Where did the coronavirus come from? In December 2019, [place] told the [employer] [facility]) of several cases of lung disease (human respiratory illness). These cases were linked to an open seafood and livestock market in the city of [place]. The link to the seafood and livestock market suggests that the virus may have spread from animals to humans. However, since that first outbreak in December, the virus has also been shown to spread from person to person. What is the name of the disease and the virus? Disease name Early on, this disease was called novel coronavirus. This is because scientists determined that the disease was caused by a new (novel) respiratory virus. The World Health Organization [facility]) has now named the disease COVID-19, or coronavirus disease. Virus name The virus that causes the disease is called severe acute respiratory syndrome coronavirus 2 (SARS-CoV-2). More information on disease and virus naming World Health Organization [facility]): www.who.int/emergencies/diseases/novel-coronavirus-2019/technical-guidance/naming-the-coronavirus-disease-(covid-2019)-and-the-virus-that-causes-it Who is at risk for complications from coronavirus disease? Some people may be at higher risk for complications from coronavirus disease. This includes older adults and people who have chronic diseases, such as heart disease, diabetes, and lung disease. If you are  at higher risk for complications, take these extra precautions:  Stay home as much as possible.  Avoid social gatherings and travel.  Avoid close contact with others. Stay at least 6 ft (2 m) away  from others, if possible.  Wash your hands often with soap and water for at least 20 seconds.  Avoid touching your face, mouth, nose, or eyes.  Keep supplies on hand at home, such as food, medicine, and cleaning supplies.  If you must go out in public, wear a cloth face covering or face mask. Make sure your mask covers your nose and mouth. How does coronavirus disease spread? The virus that causes coronavirus disease spreads easily from person to person (is contagious). You may catch the virus by:  Breathing in droplets from an infected person. Droplets can be spread by a person breathing, speaking, singing, coughing, or sneezing.  Touching something, like a table or a doorknob, that was exposed to the virus (contaminated) and then touching your mouth, nose, or eyes. Can I get the virus from touching surfaces or objects? There is still a lot that we do not know about the virus that causes coronavirus disease. Scientists are basing a lot of information on what they know about similar viruses, such as:  Viruses cannot generally survive on surfaces for long. They need a human body (host) to survive.  It is more likely that the virus is spread by close contact with people who are sick (direct contact), such as through: ? Shaking hands or hugging. ? Breathing in respiratory droplets that travel through the air. Droplets can be spread by a person breathing, speaking, singing, coughing, or sneezing.  It is less likely that the virus is spread when a person touches a surface or object that has the virus on it (indirect contact). The virus may be able to enter the body if the person touches a surface or object and then touches his or her face, eyes, nose, or mouth. Can a person spread the virus without having symptoms of the disease? It may be possible for the virus to spread before a person has symptoms of the disease, but this is most likely not the main way the virus is spreading. It is more  likely for the virus to spread by being in close contact with people who are sick and breathing in the respiratory droplets spread by a person breathing, speaking, singing, coughing, or sneezing. What are the symptoms of coronavirus disease? Symptoms vary from person to person and can range from mild to severe. Symptoms may include:  Fever or chills.  Cough.  Difficulty breathing or feeling short of breath.  Headaches, body aches, or muscle aches.  Runny or stuffy (congested) nose.  Sore throat.  New loss of taste or smell.  Nausea, vomiting, or diarrhea. These symptoms can appear anywhere from 2 to 14 days after you have been exposed to the virus. Some people may not have any symptoms. If you develop symptoms, call your health care provider. People with severe symptoms may need hospital care. Should I be tested for this virus? Your health care provider will decide whether to test you based on your symptoms, history of exposure, and your risk factors. How does a health care provider test for this virus? Health care providers will collect samples to send for testing. Samples may include:  Taking a swab of fluid from the back of your nose and throat, your nose, or your throat.  Taking fluid from the lungs by  having you cough up mucus (sputum) into a sterile cup.  Taking a blood sample. Is there a treatment or vaccine for this virus? Currently, there is no vaccine to prevent coronavirus disease. Also, there are no medicines like antibiotics or antivirals to treat the virus. A person who becomes sick is given supportive care, which means rest and fluids. A person may also relieve his or her symptoms by using over-the-counter medicines that treat sneezing, coughing, and runny nose. These are the same medicines that a person takes for the common cold. If you develop symptoms, call your health care provider. People with severe symptoms may need hospital care. What can I do to protect myself  and my family from this virus?     You can protect yourself and your family by taking the same actions that you would take to prevent the spread of other viruses. Take the following actions:  Wash your hands often with soap and water for at least 20 seconds. If soap and water are not available, use alcohol-based hand sanitizer.  Avoid touching your face, mouth, nose, or eyes.  Cough or sneeze into a tissue, sleeve, or elbow. Do not cough or sneeze into your hand or the air. ? If you cough or sneeze into a tissue, throw it away immediately and wash your hands.  Disinfect objects and surfaces that you frequently touch every day.  Stay away from people who are sick.  Avoid going out in public, follow guidance from your state and local health authorities.  Avoid crowded indoor spaces. Stay at least 6 ft (2 m) away from others.  If you must go out in public, wear a cloth face covering or face mask. Make sure your mask covers your nose and mouth.  Stay home if you are sick, except to get medical care. Call your health care provider before you get medical care. Your health care provider will tell you how long to stay home.  Make sure your vaccines are up to date. Ask your health care provider what vaccines you need. What should I do if I need to travel? Follow travel recommendations from your local health authority, the CDC, and WHO. Travel information and advice  Centers for Disease Control and Prevention (CDC): GeminiCard.gl  World Health Organization [facility]): PreviewDomains.se Know the risks and take action to protect your health  You are at higher risk of getting coronavirus disease if you are traveling to areas with an outbreak or if you are exposed to travelers from areas with an outbreak.  Wash your hands often and practice good hygiene to lower the risk of catching or spreading the  virus. What should I do if I am sick? General instructions to stop the spread of infection  Wash your hands often with soap and water for at least 20 seconds. If soap and water are not available, use alcohol-based hand sanitizer.  Cough or sneeze into a tissue, sleeve, or elbow. Do not cough or sneeze into your hand or the air.  If you cough or sneeze into a tissue, throw it away immediately and wash your hands.  Stay home unless you must get medical care. Call your health care provider or local health authority before you get medical care.  Avoid public areas. Do not take public transportation, if possible.  If you can, wear a mask if you must go out of the house or if you are in close contact with someone who is not sick. Make sure your mask covers  your nose and mouth. Keep your home clean  Disinfect objects and surfaces that are frequently touched every day. This may include: ? Counters and tables. ? Doorknobs and light switches. ? Sinks and faucets. ? Electronics such as phones, remote controls, keyboards, computers, and tablets.  Wash dishes in hot, soapy water or use a dishwasher. Air-dry your dishes.  Wash laundry in hot water. Prevent infecting other household members  Let healthy household members care for children and pets, if possible. If you have to care for children or pets, wash your hands often and wear a mask.  Sleep in a different bedroom or bed, if possible.  Do not share personal items, such as razors, toothbrushes, deodorant, combs, brushes, towels, and washcloths. Where to find more information Centers for Disease Control and Prevention (CDC)  Information and news updates: CardRetirement.czwww.cdc.gov/coronavirus/2019-ncov World Health Organization [facility](WHO)  Information and news updates: AffordableSalon.eswww.who.int/emergencies/diseases/novel-coronavirus-2019  Coronavirus health topic: https://thompson-craig.com/www.who.int/health-topics/coronavirus  Questions and answers on COVID-19:  kruiseway.comwww.who.int/news-room/q-a-detail/q-a-coronaviruses  Global tracker: who.sprinklr.com American Academy of Pediatrics (AAP)  Information for families: www.healthychildren.org/English/health-issues/conditions/chest-lungs/Pages/2019-Novel-Coronavirus.aspx The coronavirus situation is changing rapidly. Check your local health authority website or the CDC and [facility] websites for updates and news. When should I contact a health care provider?  Contact your health care provider if you have symptoms of an infection, such as fever or cough, and you: ? Have been near anyone who is known to have coronavirus disease. ? Have come into contact with a person who is suspected to have coronavirus disease. ? Have traveled to an area where there is an outbreak of COVID-19. When should I get emergency medical care?  Get help right away by calling your local emergency services (911 in the [place] if you have: ? Trouble breathing. ? Pain or pressure in your chest. ? Confusion. ? Blue-tinged lips and fingernails. ? Difficulty waking from sleep. ? Symptoms that get worse. Let the emergency medical personnel know if you think you have coronavirus disease. Summary  A new respiratory virus is spreading from person to person and causing COVID-19 (coronavirus disease).  The virus that causes COVID-19 appears to spread easily. It spreads from one person to another through droplets from breathing, speaking, singing, coughing, or sneezing.  Older adults and those with chronic diseases are at higher risk of disease. If you are at higher risk for complications, take extra precautions.  There is currently no vaccine to prevent coronavirus disease. There are no medicines, such as antibiotics or antivirals, to treat the virus.  You can protect yourself and your family by washing your hands often, avoiding touching your face, and covering your coughs and sneezes. This information is not intended to replace advice given to you  by your health care provider. Make sure you discuss any questions you have with your health care provider. Document Revised: 02/09/2019 Document Reviewed: 08/08/2018 Elsevier Patient Education  2020 Elsevier Inc.  Home Oxygen Use, Adult When a medical condition keeps you from getting enough oxygen, your health care provider may instruct you to take extra oxygen at home. Your health care provider will let you know:  When to take oxygen.  For how long to take oxygen.  How quickly oxygen should be delivered (flow rate), in liters per minute (LPM or L/M). Home oxygen can be given through:  A mask.  A nasal cannula. This is a device or tube that goes in the nostrils.  A transtracheal catheter. This is a small, flexible tube placed in the trachea.  A tracheostomy. This is  a surgically made opening in the trachea. These devices are connected with tubing to an oxygen source, such as:  A tank. Tanks hold oxygen in gas form. They must be replaced when the oxygen is used up.  A liquid oxygen device. This holds oxygen in liquid form. It must be replaced when the oxygen is used up.  An oxygen concentrator machine. This filters oxygen in the room. It uses electricity, so you must have a backup cylinder of oxygen in case the power goes out. Supplies needed: To use oxygen, you will need:  A mask, nasal cannula, transtracheal catheter, or tracheostomy.  An oxygen tank, a liquid oxygen device, or an oxygen concentrator.  The tape that your health care provider recommends (optional). If you use a transtracheal catheter and your prescribed flow rate is 1 LPM or greater, you will also need a humidifier. Risks and complications  Fire. This can happen if the oxygen is exposed to a heat source, flame, or spark.  Injury to skin. This can happen if liquid oxygen touches your skin.  Organ damage. This can happen if you get too little oxygen. How to use oxygen Your health care provider or a  representative from your medical device company will show you how to use your oxygen device. Follow her or his instructions. The instructions may look something like this: 1. Wash your hands. 2. If you use an oxygen concentrator, make sure it is plugged in. 3. Place one end of the tube into the port on the tank, device, or machine. 4. Place the mask over your nose and mouth. Or, place the nasal cannula and secure it with tape if instructed. If you use a tracheostomy or transtracheal catheter, connect it to the oxygen source as directed. 5. Make sure the liter-flow setting on the machine is at the level prescribed by your health care provider. 6. Turn on the machine or adjust the knob on the tank or device to the correct liter-flow setting. 7. When you are done, turn off and unplug the machine, or turn the knob to OFF. How to clean and care for the oxygen supplies Nasal cannula  Clean it with a warm, wet cloth daily or as needed.  Wash it with a liquid soap once a week.  Rinse it thoroughly once or twice a week.  Replace it every 2-4 weeks.  If you have an infection, such as a cold or pneumonia, change the cannula when you get better. Mask  Replace it every 2-4 weeks.  If you have an infection, such as a cold or pneumonia, change the mask when you get better. Humidifier bottle  Wash the bottle between each refill: ? Wash it with soap and warm water. ? Rinse it thoroughly. ? Disinfect it and its top. ? Air-dry it.  Make sure it is dry before you refill it. Oxygen concentrator  Clean the air filter at least twice a week according to directions from your home medical equipment and service company.  Wipe down the cabinet every day. To do this: ? Unplug the unit. ? Wipe down the cabinet with a damp cloth. ? Dry the cabinet. Other equipment  Change any extra tubing every 1-3 months.  Follow instructions from your health care provider about taking care of any other  equipment. Safety tips Fire safety tips   Keep your oxygen and oxygen supplies at least 5 ft away from sources of heat, flames, and sparks at all times.  Do not allow smoking near  your oxygen. Put up "no smoking" signs in your home. Avoid smoking areas when in public.  Do not use materials that can burn (are flammable) while you use oxygen.  When you go to a restaurant with portable oxygen, ask to be seated in the nonsmoking section.  Keep a Government social research officer close by. Let your fire department know that you have oxygen in your home.  Test your home smoke detectors regularly. Traveling  Secure your oxygen tank in the vehicle so that it does not move around. Follow instructions from your medical device company about how to safely secure your tank.  Make sure you have enough oxygen for the amount of time you will be away from home.  If you are planning air travel, contact the airline to find out if they allow the use of an approved portable oxygen concentrator. You may also need documents from your health care provider and medical device company before you travel. General safety tips  If you use an oxygen cylinder, make sure it is in a stand or secured to an object that will not move (fixed object).  If you use liquid oxygen, make sure its container is kept upright.  If you use an oxygen concentrator: ? Catering manager company. Make sure you are given priority service in the event that your power goes out. ? Avoid using extension cords, if possible. Follow these instructions at home:  Use oxygen only as told by your health care provider.  Do not use alcohol or other drugs that make you relax (sedating drugs) unless instructed. They can slow down your breathing rate and make it hard to get in enough oxygen.  Know how and when to order a refill of oxygen.  Always keep a spare tank of oxygen. Plan ahead for holidays when you may not be able to get a prescription filled.  Use  water-based lubricants on your lips or nostrils. Do not use oil-based products like petroleum jelly.  To prevent skin irritation on your cheeks or behind your ears, tuck some gauze under the tubing. Contact a health care provider if:  You get headaches often.  You have shortness of breath.  You have a lasting cough.  You have anxiety.  You are sleepy all the time.  You develop an illness that affects your breathing.  You cannot exercise at your regular level.  You are restless.  You have difficult or irregular breathing, and it is getting worse.  You have a fever.  You have persistent redness under your nose. Get help right away if:  You are confused.  You have blue lips or fingernails.  You are struggling to breathe. Summary  Your health care provider or a representative from your medical device company will show you how to use your oxygen device. Follow her or his instructions.  If you use an oxygen concentrator, make sure it is plugged in.  Make sure the liter-flow setting on the machine is at the level prescribed by your health care provider.  Keep your oxygen and oxygen supplies at least 5 ft away from sources of heat, flames, and sparks at all times. This information is not intended to replace advice given to you by your health care provider. Make sure you discuss any questions you have with your health care provider. Document Revised: 09/29/2017 Document Reviewed: 11/04/2015 Elsevier Patient Education  2020 [employer]   IMPORTANT INFORMATION: PAY CLOSE ATTENTION   PHYSICIAN DISCHARGE INSTRUCTIONS  Follow with Primary care provider  Patient, No Pcp Per  and other consultants as instructed by your Hospitalist Physician  SEEK MEDICAL CARE OR RETURN TO EMERGENCY ROOM IF SYMPTOMS COME BACK, WORSEN OR NEW PROBLEM DEVELOPS   Please note: You were cared for by a hospitalist during your hospital stay. Every effort will be made to forward records to your  primary care provider.  You can request that your primary care provider send for your hospital records if they have not received them.  Once you are discharged, your primary care physician will handle any further medical issues. Please note that NO REFILLS for any discharge medications will be authorized once you are discharged, as it is imperative that you return to your primary care physician (or establish a relationship with a primary care physician if you do not have one) for your post hospital discharge needs so that they can reassess your need for medications and monitor your lab values.  Please get a complete blood count and chemistry panel checked by your Primary MD at your next visit, and again as instructed by your Primary MD.  Get Medicines reviewed and adjusted: Please take all your medications with you for your next visit with your Primary MD  Laboratory/radiological data: Please request your Primary MD to go over all hospital tests and procedure/radiological results at the follow up, please ask your primary care provider to get all Hospital records sent to his/her office.  In some cases, they will be blood work, cultures and biopsy results pending at the time of your discharge. Please request that your primary care provider follow up on these results.  If you are diabetic, please bring your blood sugar readings with you to your follow up appointment with primary care.    Please call and make your follow up appointments as soon as possible.    Also Note the following: If you experience worsening of your admission symptoms, develop shortness of breath, life threatening emergency, suicidal or homicidal thoughts you must seek medical attention immediately by calling 911 or calling your MD immediately  if symptoms less severe.  You must read complete instructions/literature along with all the possible adverse reactions/side effects for all the Medicines you take and that have been  prescribed to you. Take any new Medicines after you have completely understood and accpet all the possible adverse reactions/side effects.   Do not drive when taking Pain medications or sleeping medications (Benzodiazepines)  Do not take more than prescribed Pain, Sleep and Anxiety Medications. It is not advisable to combine anxiety,sleep and pain medications without talking with your primary care practitioner  Special Instructions: If you have smoked or chewed Tobacco  in the last 2 yrs please stop smoking, stop any regular Alcohol  and or any Recreational drug use.  Wear Seat belts while driving.  Do not drive if taking any narcotic, mind altering or controlled substances or recreational drugs or alcohol.

## 2020-02-07 NOTE — TOC Transition Note (Signed)
Transition of Care [facility]) - CM/SW Discharge Note   Patient Details  Name: Bill Whitehead MRN: [id number] Date of Birth: August 22, 1965  [facility] CM/SW Contact:  Annice Needy, LCSW Phone Number: 02/07/2020, 11:35 AM   Clinical Narrative:    Patient [id number] home, admitted with COVID. Uninsured. [employer] can provide charity oxygen. Patient advised that he will have to provide banking information to the company.    Final next level of care: Home/Self Care Barriers to Discharge: No Barriers Identified   Patient Goals and CMS Choice        Discharge Placement                       Discharge Plan and Services                DME Arranged: Oxygen DME Agency: [employer] Date DME Agency Contacted: 02/07/20 Time DME Agency Contacted: [id number] Representative spoke with at DME Agency: [employer]            Social Determinants of Health (SDOH) Interventions     Readmission Risk Interventions No flowsheet data found.

## 2020-02-07 NOTE — Discharge Summary (Signed)
Physician Discharge Summary  Bill Whitehead ZOX:[id number] DOB: 01/19/1966 DOA: 02/04/2020  Admit date: 02/04/2020 Discharge date: 02/07/2020  Admitted From:  HOME  Disposition:  HOME   Recommendations for Outpatient Follow-up:  1. Arrangements made for outpatient remdesivir infusion center to complete 1 final treatment to complete course   Patient scheduled for outpatient Remdesivir infusions at 10am on Friday 10/15 at [facility]. Please inform the patient to park at[address], [place], as staff will be escorting the patient through the east entrance of the hospital.Appointments take approximately 45 minutes.   Home Health:  DME Home Oxygen   Discharge Condition: STABLE   CODE STATUS: FULL    Brief Hospitalization Summary: Please see all hospital notes, images, labs for full details of the hospitalization. ADMISSION HPI: The patient is a 54 y.o. year-old w/ no sig PMH presents to ED w/ c/o shortness of breath. 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 Not immunized for COVID-19. Reports 4d hx of cough, chills, fevers, body aches, headache and shortness of breath. No hx of heart disease, HTN, clotting disorder, kidney disease. In ED w/u showed +COVID test and high inflammatory markers (^d-dimer, fibrinogen, ldh). CXR shows "nonspecific asymmetric perihilar opacity". Sats in ED not documented were in the high 80's on RA and are 95- 97% on 3-4 L [place]. Asked to see for admission.    Pt states in addition to above, headaches have been particularly difficult, he takes shallow breaths and tries to avoid coughing to keep the headaches from getting worse.  Otherwise healthy, no prior sig hospitalization. Alcohol dependence listed in problem list, pt denies etoh dependence, drinks "maybe 2 beers a day", denies hx DT's.   No appetite, no n/v/d or abd pain but just can't eat very much.    Brief Summary:- 54 year old obese man with history of alcohol abuse admitted on 02/04/2020 with  acute hypoxic respiratory failure secondary to Covid pneumonia -Patient is Not vaccinated against COVID-19  A/p 1)Acute hypoxic respiratory failure secondary to COVID-19 infection/Pneumonia--- The treatment plan and use of medications for treatment of COVID-19 infectionand possible side effects were discussed with patient/family -----Patient/Family verbalizes understanding and agrees to treatment protocols --Pt is consistently requiring 4 L of oxygen via nasal cannula, patient desaturates very quickly with activity --Patient is positive for COVID-19 infection, chest x-ray with findings of infiltrates/opacities,  patient is tachypneic/hypoxic and requiring continuous supplemental oxygen---patient meets criteria for initiation of Remdesivir AND Steroid therapy per protocol  --Followed inflammatory markers including D-dimer, ferritin and  CRP---also follow CBC and CMP --Supplemental oxygen to keep O2 sats above 93% -Follow serial chest x-rays and ABGs as indicated --- Encouraged prone positioning for More than 16 hours/day in increments of 2 to 3 hours at a time if able to tolerate --Attempt to maintain euvolemic state --Zinc and vitamin C as ordered -Albuterol inhaler ordered -PPI while on steroids -Enhanced dosage of anticoagulant for DVT prophylaxis given hypercoagulable state with COVID-19 infections He will discharge to complete steroids dexamethasone to complete remaining course.   Pt says he is feeling better and wants to go home.   He is ambulating in room.  No complaints.    COVID-19 Labs  Recent Labs (last 2 labs)         Recent Labs    02/04/20 1439 02/05/20 0832 02/05/20 0833 02/06/20 0701  DDIMER 1.75* 1.69*  --  1.56*  FERRITIN 649*  --  688* 934*  LDH 302*  --   --   --  CRP 15.7*  --  18.8* 13.9*      Recent Labs       Lab Results  Component Value Date   SARSCOV2NAA POSITIVE (A) 02/04/2020      2)H/o ETOH Abuse--denies excessive use of alcohol  recently, lorazepam per CIWA protocol, folic acid and thiamine as ordered  3) Obesity- -this complicates overall care, increases morbidity and mortality -Body mass index is 32.02 kg/m.  4) blood cultures with staph epidermidis ------- due to skin contaminant  Discharge Diagnoses:  Principal Problem:   Pneumonia due to COVID-19 virus Active Problems:   Acute respiratory failure with hypoxia (HCC)   Acute respiratory disease due to COVID-19 virus with Hypoxia  Discharge Instructions:  Allergies as of 02/07/2020   No Known Allergies     Medication List    TAKE these medications   acetaminophen 325 MG tablet Commonly known as: TYLENOL Take 2 tablets (650 mg total) by mouth every 6 (six) hours as needed for mild pain or headache (fever >/= 101).   albuterol 108 (90 Base) MCG/ACT inhaler Commonly known as: VENTOLIN HFA Inhale 2 puffs into the lungs 4 (four) times daily.   ascorbic acid 500 MG tablet Commonly known as: VITAMIN C Take 1 tablet (500 mg total) by mouth daily. Start taking on: February 08, 2020   dexamethasone 6 MG tablet Commonly known as: DECADRON Take 1 tablet (6 mg total) by mouth daily with breakfast for 6 days.   folic acid 1 MG tablet Commonly known as: FOLVITE Take 1 tablet (1 mg total) by mouth daily. Start taking on: February 08, 2020   guaiFENesin-dextromethorphan 100-10 MG/5ML syrup Commonly known as: ROBITUSSIN DM Take 10 mLs by mouth every 4 (four) hours as needed for cough.   multivitamin with minerals Tabs tablet Take 1 tablet by mouth daily. Start taking on: February 08, 2020   omeprazole 20 MG tablet Commonly known as: PriLOSEC OTC Take 1 tablet (20 mg total) by mouth daily.   thiamine 100 MG tablet Take 1 tablet (100 mg total) by mouth daily. Start taking on: February 08, 2020   zinc sulfate 220 (50 Zn) MG capsule Take 1 capsule (220 mg total) by mouth daily. Start taking on: February 08, 2020            Durable Medical  Equipment  (From admission, onward)         Start     Ordered   02/07/20 1031  For home use only DME oxygen  Once       Question Answer Comment  Length of Need 6 Months   Mode or (Route) Nasal cannula   Liters per Minute 4   Frequency Continuous (stationary and portable oxygen unit needed)   Oxygen conserving device Yes   Oxygen delivery system Gas      02/07/20 1030          No Known Allergies Allergies as of 02/07/2020   No Known Allergies     Medication List    TAKE these medications   acetaminophen 325 MG tablet Commonly known as: TYLENOL Take 2 tablets (650 mg total) by mouth every 6 (six) hours as needed for mild pain or headache (fever >/= 101).   albuterol 108 (90 Base) MCG/ACT inhaler Commonly known as: VENTOLIN HFA Inhale 2 puffs into the lungs 4 (four) times daily.   ascorbic acid 500 MG tablet Commonly known as: VITAMIN C Take 1 tablet (500 mg total) by mouth daily. Start taking on: February 08, 2020   dexamethasone 6 MG tablet Commonly known as: DECADRON Take 1 tablet (6 mg total) by mouth daily with breakfast for 6 days.   folic acid 1 MG tablet Commonly known as: FOLVITE Take 1 tablet (1 mg total) by mouth daily. Start taking on: February 08, 2020   guaiFENesin-dextromethorphan 100-10 MG/5ML syrup Commonly known as: ROBITUSSIN DM Take 10 mLs by mouth every 4 (four) hours as needed for cough.   multivitamin with minerals Tabs tablet Take 1 tablet by mouth daily. Start taking on: February 08, 2020   omeprazole 20 MG tablet Commonly known as: PriLOSEC OTC Take 1 tablet (20 mg total) by mouth daily.   thiamine 100 MG tablet Take 1 tablet (100 mg total) by mouth daily. Start taking on: February 08, 2020   zinc sulfate 220 (50 Zn) MG capsule Take 1 capsule (220 mg total) by mouth daily. Start taking on: February 08, 2020            Durable Medical Equipment  (From admission, onward)         Start     Ordered   02/07/20 1031  For  home use only DME oxygen  Once       Question Answer Comment  Length of Need 6 Months   Mode or (Route) Nasal cannula   Liters per Minute 4   Frequency Continuous (stationary and portable oxygen unit needed)   Oxygen conserving device Yes   Oxygen delivery system Gas      02/07/20 1030          Procedures/Studies: DG Chest Port 1 View  Result Date: 02/04/2020 CLINICAL DATA:  54 year old male with shortness of breath, hypoxia, COVID-19. Status pending. EXAM: PORTABLE CHEST 1 VIEW COMPARISON:  Neck CT 02/28/2018. FINDINGS: Portable AP upright view at 1520 hours. Low lung volumes. Mediastinal contours are within normal limits. Visualized tracheal air column is within normal limits. Streaky asymmetric perihilar pulmonary opacity. No pneumothorax, pulmonary edema or pleural effusion. Negative visible bowel gas pattern and osseous structures. IMPRESSION: Low lung volumes with nonspecific asymmetric perihilar opacity. Top considerations in this setting are atelectasis and viral/atypical respiratory infection. Electronically Signed   By: Odessa Fleming M.D.   On: 02/04/2020 15:44      Subjective: Pt says he is feeling much better.  He has no complaints today.  He is ambulating in the room.    Discharge Exam: Vitals:   02/07/20 0546 02/07/20 0830  BP: 109/79   Pulse: 74   Resp: 16   Temp: 98.1 F (36.7 C)   SpO2: 94% 90%   Vitals:   02/06/20 2039 02/06/20 2152 02/07/20 0546 02/07/20 0830  BP:  120/88 109/79   Pulse:  79 74   Resp:  19 16   Temp:  98.3 F (36.8 C) 98.1 F (36.7 C)   TempSrc:  Oral Oral   SpO2: (!) 89% 93% 94% 90%  Weight:      Height:       General: Pt is alert, awake, not in acute distress Cardiovascular: RRR, S1/S2 +, no rubs, no gallops Respiratory:no increased work of breathing.   Abdominal: Soft, NT, ND, bowel sounds + Extremities: no edema, no cyanosis   The results of significant diagnostics from this hospitalization (including imaging, microbiology,  ancillary and laboratory) are listed below for reference.     Microbiology: Recent Results (from the past 240 hour(s))  Blood Culture (routine x 2)     Status: None (Preliminary result)  Collection Time: 02/04/20  2:39 PM   Specimen: Right Antecubital; Blood  Result Value Ref Range Status   Specimen Description   Final    RIGHT ANTECUBITAL BOTTLES DRAWN AEROBIC AND ANAEROBIC   Special Requests Blood Culture adequate volume  Final   Culture   Final    NO GROWTH 3 DAYS Performed at [facility], [address] [place], [place] [address]    Report Status PENDING  Incomplete  Respiratory Panel by RT PCR (Flu A&B, Covid) - Nasopharyngeal Swab     Status: Abnormal   Collection Time: 02/04/20  2:44 PM   Specimen: Nasopharyngeal Swab  Result Value Ref Range Status   SARS Coronavirus 2 by RT PCR POSITIVE (A) NEGATIVE Final    Comment: CRITICAL RESULT CALLED TO, READ BACK BY AND VERIFIED WITH: ROBIN GENTRY,RN @1640  02/04/2020 KAY (NOTE) SARS-CoV-2 target nucleic acids are DETECTED.  SARS-CoV-2 RNA is generally detectable in upper respiratory specimens  during the acute phase of infection. Positive results are indicative of the presence of the identified virus, but do not rule out bacterial infection or co-infection with other pathogens not detected by the test. Clinical correlation with patient history and other diagnostic information is necessary to determine patient infection status. The expected result is Negative.  Fact Sheet for Patients:  https://www.moore.com/  Fact Sheet for Healthcare Providers: https://www.young.biz/  This test is not yet approved or cleared by the [place] FDA and  has been authorized for detection and/or diagnosis of SARS-CoV-2 by FDA under an Emergency Use Authorization (EUA).  This EUA will remain in effect (meaning this t est can be used) for the duration of  the COVID-19 declaration under Section 564(b)(1)  of the Act, 21 U.S.C. section 360bbb-3(b)(1), unless the authorization is terminated or revoked sooner.      Influenza A by PCR NEGATIVE NEGATIVE Final   Influenza B by PCR NEGATIVE NEGATIVE Final    Comment: (NOTE) The Xpert Xpress SARS-CoV-2/FLU/RSV assay is intended as an aid in  the diagnosis of influenza from Nasopharyngeal swab specimens and  should not be used as a sole basis for treatment. Nasal washings and  aspirates are unacceptable for Xpert Xpress SARS-CoV-2/FLU/RSV  testing.  Fact Sheet for Patients: https://www.moore.com/  Fact Sheet for Healthcare Providers: https://www.young.biz/  This test is not yet approved or cleared by the [place] FDA and  has been authorized for detection and/or diagnosis of SARS-CoV-2 by  FDA under an Emergency Use Authorization (EUA). This EUA will remain  in effect (meaning this test can be used) for the duration of the  Covid-19 declaration under Section 564(b)(1) of the Act, 21  U.S.C. section 360bbb-3(b)(1), unless the authorization is  terminated or revoked. Performed at [facility], [address] [place], [place] [address]   Blood Culture (routine x 2)     Status: Abnormal   Collection Time: 02/04/20  2:44 PM   Specimen: Left Antecubital; Blood  Result Value Ref Range Status   Specimen Description   Final    LEFT ANTECUBITAL BOTTLES DRAWN AEROBIC AND ANAEROBIC Performed at [facility], [address], [place], [place] [address]    Special Requests   Final    Blood Culture adequate volume Performed at [facility], [address], [place], [place] [address]    Culture  Setup Time   Final    GRAM POSITIVE COCCI ANAEROBIC BOTTLE Gram Stain Report Called to,Read Back By and Verified With: ALSTON,C@1400  BY MATTHEWS, B 10.12.2021    Culture (A)  Final    STAPHYLOCOCCUS EPIDERMIDIS THE SIGNIFICANCE OF ISOLATING THIS ORGANISM FROM A SINGLE SET OF BLOOD CULTURES WHEN MULTIPLE SETS ARE  DRAWN IS UNCERTAIN. PLEASE NOTIFY THE MICROBIOLOGY DEPARTMENT WITHIN ONE WEEK IF SPECIATION AND SENSITIVITIES ARE REQUIRED. Performed at [facility], [address] [place], [place] [address]    Report Status 02/07/2020 FINAL  Final  Blood Culture ID Panel (Reflexed)     Status: Abnormal   Collection Time: 02/04/20  2:44 PM  Result Value Ref Range Status   Enterococcus faecalis NOT DETECTED NOT DETECTED Final   Enterococcus Faecium NOT DETECTED NOT DETECTED Final   Listeria monocytogenes NOT DETECTED NOT DETECTED Final   Staphylococcus species DETECTED (A) NOT DETECTED Final    Comment: CRITICAL RESULT CALLED TO, READ BACK BY AND VERIFIED WITH: A MOORE RN 02/05/20 AT 1800 SK    Staphylococcus aureus (BCID) NOT DETECTED NOT DETECTED Final   Staphylococcus epidermidis DETECTED (A) NOT DETECTED Final    Comment: Methicillin (oxacillin) resistant coagulase negative staphylococcus. Possible blood culture contaminant (unless isolated from more than one blood culture draw or clinical case suggests pathogenicity). No antibiotic treatment is indicated for blood  culture contaminants. CRITICAL RESULT CALLED TO, READ BACK BY AND VERIFIED WITH: A MOORE RN 02/05/20 AT 1800 SK    Staphylococcus lugdunensis NOT DETECTED NOT DETECTED Final   Streptococcus species NOT DETECTED NOT DETECTED Final   Streptococcus agalactiae NOT DETECTED NOT DETECTED Final   Streptococcus pneumoniae NOT DETECTED NOT DETECTED Final   Streptococcus pyogenes NOT DETECTED NOT DETECTED Final   A.calcoaceticus-baumannii NOT DETECTED NOT DETECTED Final   Bacteroides fragilis NOT DETECTED NOT DETECTED Final   Enterobacterales NOT DETECTED NOT DETECTED Final   Enterobacter cloacae complex NOT DETECTED NOT DETECTED Final   Escherichia coli NOT DETECTED NOT DETECTED Final   Klebsiella aerogenes NOT DETECTED NOT DETECTED Final   Klebsiella oxytoca NOT DETECTED NOT DETECTED Final   Klebsiella pneumoniae NOT DETECTED NOT  DETECTED Final   Proteus species NOT DETECTED NOT DETECTED Final   Salmonella species NOT DETECTED NOT DETECTED Final   Serratia marcescens NOT DETECTED NOT DETECTED Final   Haemophilus influenzae NOT DETECTED NOT DETECTED Final   Neisseria meningitidis NOT DETECTED NOT DETECTED Final   Pseudomonas aeruginosa NOT DETECTED NOT DETECTED Final   Stenotrophomonas maltophilia NOT DETECTED NOT DETECTED Final   Candida albicans NOT DETECTED NOT DETECTED Final   Candida auris NOT DETECTED NOT DETECTED Final   Candida glabrata NOT DETECTED NOT DETECTED Final   Candida krusei NOT DETECTED NOT DETECTED Final   Candida parapsilosis NOT DETECTED NOT DETECTED Final   Candida tropicalis NOT DETECTED NOT DETECTED Final   Cryptococcus neoformans/gattii NOT DETECTED NOT DETECTED Final   Methicillin resistance mecA/C DETECTED (A) NOT DETECTED Final    Comment: CRITICAL RESULT CALLED TO, READ BACK BY AND VERIFIED WITH: A MOORE RN 02/05/20 AT 1800 SK Performed at [facility], [address], [place], [place] [address]      Labs: BNP (last 3 results) No results for input(s): BNP in the last 8760 hours. Basic Metabolic Panel: Recent Labs  Lab 02/04/20 1439 02/05/20 0832 02/06/20 0701 02/07/20 0804  NA 134* 134* 134* 134*  K 4.0 3.8 4.3 4.1  CL 97* 99 100 101  CO2 23 24 25 24   GLUCOSE 106* 107* 130* 115*  BUN 15 16 19  21*  CREATININE 1.31* 0.98 0.88 0.84  CALCIUM 9.0 8.7* 8.8* 8.9  MG  --  2.7* 2.9* 2.7*  PHOS  --  2.2* 2.9 2.5   Liver Function Tests: Recent Labs  Lab 02/04/20 1439 02/05/20 0832 02/06/20 0701 02/07/20 0804  AST 45* 38 33 23  ALT 45* 44 47* 43  ALKPHOS 46 42 47 46  BILITOT 1.3* 1.0 0.5 0.6  PROT 8.6* 7.7 7.5 7.6  ALBUMIN 4.1 3.3* 3.2* 3.2*   No results for input(s): LIPASE, AMYLASE in the last 168 hours. No results for input(s): AMMONIA in the last 168 hours. CBC: Recent Labs  Lab 02/04/20 1439 02/05/20 0832 02/06/20 0701 02/07/20 0804  WBC 10.0 13.2*  8.6 10.6*  NEUTROABS 8.2* 11.1* 7.3 8.6*  HGB 13.7 12.5* 13.2 13.6  HCT 42.2 38.0* 40.9 41.9  MCV 89.4 86.2 86.7 86.4  PLT 222 226 273 305   Cardiac Enzymes: No results for input(s): CKTOTAL, CKMB, CKMBINDEX, TROPONINI in the last 168 hours. BNP: Invalid input(s): POCBNP CBG: Recent Labs  Lab 02/06/20 0734 02/06/20 1218 02/06/20 1633 02/06/20 2154 02/07/20 0728  GLUCAP 126* 132* 154* 164* 113*   D-Dimer Recent Labs    02/06/20 0701 02/07/20 0804  DDIMER 1.56* 1.22*   Hgb A1c No results for input(s): HGBA1C in the last 72 hours. Lipid Profile Recent Labs    02/04/20 1439  TRIG 70   Thyroid function studies No results for input(s): TSH, T4TOTAL, T3FREE, THYROIDAB in the last 72 hours.  Invalid input(s): FREET3 Anemia work up Recent Labs    02/06/20 0701 02/07/20 0804  FERRITIN 934* 846*   Urinalysis No results found for: COLORURINE, APPEARANCEUR, LABSPEC, PHURINE, GLUCOSEU, HGBUR, BILIRUBINUR, KETONESUR, PROTEINUR, UROBILINOGEN, NITRITE, LEUKOCYTESUR Sepsis Labs Invalid input(s): PROCALCITONIN,  WBC,  LACTICIDVEN Microbiology Recent Results (from the past 240 hour(s))  Blood Culture (routine x 2)     Status: None (Preliminary result)   Collection Time: 02/04/20  2:39 PM   Specimen: Right Antecubital; Blood  Result Value Ref Range Status   Specimen Description   Final    RIGHT ANTECUBITAL BOTTLES DRAWN AEROBIC AND ANAEROBIC   Special Requests Blood Culture adequate volume  Final   Culture   Final    NO GROWTH 3 DAYS Performed at [facility], [address], [place], [place] [address]    Report Status PENDING  Incomplete  Respiratory Panel by RT PCR (Flu A&B, Covid) - Nasopharyngeal Swab     Status: Abnormal   Collection Time: 02/04/20  2:44 PM   Specimen: Nasopharyngeal Swab  Result Value Ref Range Status   SARS Coronavirus 2 by RT PCR POSITIVE (A) NEGATIVE Final    Comment: CRITICAL RESULT CALLED TO, READ BACK BY AND VERIFIED WITH: ROBIN GENTRY,RN   02/04/2020 KAY (NOTE) SARS-CoV-2 target nucleic acids are DETECTED.  SARS-CoV-2 RNA is generally detectable in upper respiratory specimens  during the acute phase of infection. Positive results are indicative of the presence of the identified virus, but do not rule out bacterial infection or co-infection with other pathogens not detected by the test. Clinical correlation with patient history and other diagnostic information is necessary to determine patient infection status. The expected result is Negative.  Fact Sheet for Patients:  https://www.moore.com/  Fact Sheet for Healthcare Providers: https://www.young.biz/  This test is not yet approved or cleared by the [place] FDA and  has been authorized for detection and/or diagnosis of SARS-CoV-2 by FDA under an Emergency Use Authorization (EUA).  This EUA will remain in effect (meaning this t est can be used) for the duration of  the COVID-19 declaration under Section 564(b)(1) of  the Act, 21 U.S.C. section 360bbb-3(b)(1), unless the authorization is terminated or revoked sooner.      Influenza A by PCR NEGATIVE NEGATIVE Final   Influenza B by PCR NEGATIVE NEGATIVE Final    Comment: (NOTE) The Xpert Xpress SARS-CoV-2/FLU/RSV assay is intended as an aid in  the diagnosis of influenza from Nasopharyngeal swab specimens and  should not be used as a sole basis for treatment. Nasal washings and  aspirates are unacceptable for Xpert Xpress SARS-CoV-2/FLU/RSV  testing.  Fact Sheet for Patients: https://www.moore.com/  Fact Sheet for Healthcare Providers: https://www.young.biz/  This test is not yet approved or cleared by the [place] FDA and  has been authorized for detection and/or diagnosis of SARS-CoV-2 by  FDA under an Emergency Use Authorization (EUA). This EUA will remain  in effect (meaning this test can be used) for the duration  of the  Covid-19 declaration under Section 564(b)(1) of the Act, 21  U.S.C. section 360bbb-3(b)(1), unless the authorization is  terminated or revoked. Performed at [facility], [address], [place], [place] [address]   Blood Culture (routine x 2)     Status: Abnormal   Collection Time: 02/04/20  2:44 PM   Specimen: Left Antecubital; Blood  Result Value Ref Range Status   Specimen Description   Final    LEFT ANTECUBITAL BOTTLES DRAWN AEROBIC AND ANAEROBIC Performed at [facility], [address], [place], [place] [address]    Special Requests   Final    Blood Culture adequate volume Performed at [facility], [address], [place], [place] [address]    Culture  Setup Time   Final    GRAM POSITIVE COCCI ANAEROBIC BOTTLE Gram Stain Report Called to,Read Back By and Verified With: ALSTON,C@1400  BY MATTHEWS, B 10.12.2021    Culture (A)  Final    STAPHYLOCOCCUS EPIDERMIDIS THE SIGNIFICANCE OF ISOLATING THIS ORGANISM FROM A SINGLE SET OF BLOOD CULTURES WHEN MULTIPLE SETS ARE DRAWN IS UNCERTAIN. PLEASE NOTIFY THE MICROBIOLOGY DEPARTMENT WITHIN ONE WEEK IF SPECIATION AND SENSITIVITIES ARE REQUIRED. Performed at [facility], [address], [place], [place] [address]    Report Status 02/07/2020 FINAL  Final  Blood Culture ID Panel (Reflexed)     Status: Abnormal   Collection Time: 02/04/20  2:44 PM  Result Value Ref Range Status   Enterococcus faecalis NOT DETECTED NOT DETECTED Final   Enterococcus Faecium NOT DETECTED NOT DETECTED Final   Listeria monocytogenes NOT DETECTED NOT DETECTED Final   Staphylococcus species DETECTED (A) NOT DETECTED Final    Comment: CRITICAL RESULT CALLED TO, READ BACK BY AND VERIFIED WITH: A MOORE RN 02/05/20 AT 1800 SK    Staphylococcus aureus (BCID) NOT DETECTED NOT DETECTED Final   Staphylococcus epidermidis DETECTED (A) NOT DETECTED Final    Comment: Methicillin (oxacillin) resistant coagulase negative staphylococcus. Possible blood  culture contaminant (unless isolated from more than one blood culture draw or clinical case suggests pathogenicity). No antibiotic treatment is indicated for blood  culture contaminants. CRITICAL RESULT CALLED TO, READ BACK BY AND VERIFIED WITH: A MOORE RN 02/05/20 AT 1800 SK    Staphylococcus lugdunensis NOT DETECTED NOT DETECTED Final   Streptococcus species NOT DETECTED NOT DETECTED Final   Streptococcus agalactiae NOT DETECTED NOT DETECTED Final   Streptococcus pneumoniae NOT DETECTED NOT DETECTED Final   Streptococcus pyogenes NOT DETECTED NOT DETECTED Final   A.calcoaceticus-baumannii NOT DETECTED NOT DETECTED Final   Bacteroides fragilis NOT DETECTED NOT DETECTED Final   Enterobacterales NOT DETECTED NOT  DETECTED Final   Enterobacter cloacae complex NOT DETECTED NOT DETECTED Final   Escherichia coli NOT DETECTED NOT DETECTED Final   Klebsiella aerogenes NOT DETECTED NOT DETECTED Final   Klebsiella oxytoca NOT DETECTED NOT DETECTED Final   Klebsiella pneumoniae NOT DETECTED NOT DETECTED Final   Proteus species NOT DETECTED NOT DETECTED Final   Salmonella species NOT DETECTED NOT DETECTED Final   Serratia marcescens NOT DETECTED NOT DETECTED Final   Haemophilus influenzae NOT DETECTED NOT DETECTED Final   Neisseria meningitidis NOT DETECTED NOT DETECTED Final   Pseudomonas aeruginosa NOT DETECTED NOT DETECTED Final   Stenotrophomonas maltophilia NOT DETECTED NOT DETECTED Final   Candida albicans NOT DETECTED NOT DETECTED Final   Candida auris NOT DETECTED NOT DETECTED Final   Candida glabrata NOT DETECTED NOT DETECTED Final   Candida krusei NOT DETECTED NOT DETECTED Final   Candida parapsilosis NOT DETECTED NOT DETECTED Final   Candida tropicalis NOT DETECTED NOT DETECTED Final   Cryptococcus neoformans/gattii NOT DETECTED NOT DETECTED Final   Methicillin resistance mecA/C DETECTED (A) NOT DETECTED Final    Comment: CRITICAL RESULT CALLED TO, READ BACK BY AND VERIFIED WITH: A  MOORE RN 02/05/20 AT 1800 SK Performed at [facility], [address], [place], [place] [address]    Time coordinating discharge: 35 mins  SIGNED:  Standley Dakins, MD  [facility] 02/07/2020, 10:43 AM How to contact the [facility] Attending or Consulting provider 7A - 7P or covering provider during after hours 7P -7A, for this patient?  1. Check the care team in [facility] and look for a) attending/consulting [facility] provider listed and b) the [facility] team listed 2. Log into [email or web address] and use [place]'s universal password to access. If you do not have the password, please contact the hospital operator. 3. Locate the [facility] provider you are looking for under [facility] and page to a number that you can be directly reached. 4. If you still have difficulty reaching the provider, please page the [facility] (Director on Call) for the Hospitalists listed on amion for assistance.

## 2020-02-08 ENCOUNTER — Ambulatory Visit (HOSPITAL_COMMUNITY)
Admit: 2020-02-08 | Discharge: 2020-02-08 | Disposition: A | Discharge status: Home / Self Care | Payer: Self-pay | Source: Ambulatory Visit | Attending: Pulmonary Disease | Admitting: Pulmonary Disease

## 2020-02-08 DIAGNOSIS — J1289 Other viral pneumonia: Secondary | ICD-10-CM | POA: Insufficient documentation

## 2020-02-08 DIAGNOSIS — U071 COVID-19: Secondary | ICD-10-CM | POA: Diagnosis not present

## 2020-02-08 MED ORDER — METHYLPREDNISOLONE SODIUM SUCC 125 MG IJ SOLR: 125.0000 mg | Freq: Once | INTRAMUSCULAR | Status: DC | PRN

## 2020-02-08 MED ORDER — ALBUTEROL SULFATE HFA 108 (90 BASE) MCG/ACT IN AERS: 2.0000 | INHALATION_SPRAY | Freq: Once | RESPIRATORY_TRACT | Status: DC | PRN

## 2020-02-08 MED ORDER — SODIUM CHLORIDE 0.9 % IV SOLN: INTRAVENOUS | Status: DC | PRN

## 2020-02-08 MED ORDER — EPINEPHRINE 0.3 MG/0.3ML IJ SOAJ: 0.3000 mg | Freq: Once | INTRAMUSCULAR | Status: DC | PRN

## 2020-02-08 MED ORDER — FAMOTIDINE IN NACL 20-0.9 MG/50ML-% IV SOLN: 20.0000 mg | Freq: Once | INTRAVENOUS | Status: DC | PRN

## 2020-02-08 MED ORDER — SODIUM CHLORIDE 0.9 % IV SOLN
100.0000 mg | Freq: Once | INTRAVENOUS | Status: AC
  Administered 2020-02-08: 100 mg via INTRAVENOUS
  Filled 2020-02-08: qty 20

## 2020-02-08 MED ORDER — DIPHENHYDRAMINE HCL 50 MG/ML IJ SOLN: 50.0000 mg | Freq: Once | INTRAMUSCULAR | Status: DC | PRN

## 2020-02-08 NOTE — Progress Notes (Signed)
  Diagnosis: COVID-19  Physician: Dr. Delford Field  Procedure: [facility] Med: remdesivir infusion - Provided patient with remdesivir fact sheet for patients, parents and caregivers prior to infusion.  Complications: No immediate complications noted.  Discharge: Discharged home   [facility] 02/08/2020

## 2020-02-09 LAB — CULTURE, BLOOD (ROUTINE X 2)
Culture: NO GROWTH
Special Requests: ADEQUATE

## 2022-12-31 ENCOUNTER — Ambulatory Visit (INDEPENDENT_AMBULATORY_CARE_PROVIDER_SITE_OTHER): Payer: BC Managed Care – PPO | Admitting: Family Medicine

## 2022-12-31 ENCOUNTER — Ambulatory Visit (INDEPENDENT_AMBULATORY_CARE_PROVIDER_SITE_OTHER): Payer: BC Managed Care – PPO

## 2022-12-31 ENCOUNTER — Encounter: Payer: Self-pay | Admitting: Family Medicine

## 2022-12-31 VITALS — BP 143/92 | HR 89 | Temp 97.3°F | Ht 70.5 in | Wt 257.2 lb

## 2022-12-31 DIAGNOSIS — Z6836 Body mass index (BMI) 36.0-36.9, adult: Secondary | ICD-10-CM | POA: Diagnosis not present

## 2022-12-31 DIAGNOSIS — F1029 Alcohol dependence with unspecified alcohol-induced disorder: Secondary | ICD-10-CM

## 2022-12-31 DIAGNOSIS — M25561 Pain in right knee: Secondary | ICD-10-CM

## 2022-12-31 DIAGNOSIS — R739 Hyperglycemia, unspecified: Secondary | ICD-10-CM | POA: Diagnosis not present

## 2022-12-31 DIAGNOSIS — Z136 Encounter for screening for cardiovascular disorders: Secondary | ICD-10-CM | POA: Diagnosis not present

## 2022-12-31 DIAGNOSIS — M25562 Pain in left knee: Secondary | ICD-10-CM

## 2022-12-31 DIAGNOSIS — G8929 Other chronic pain: Secondary | ICD-10-CM

## 2022-12-31 DIAGNOSIS — Z1331 Encounter for screening for depression: Secondary | ICD-10-CM

## 2022-12-31 DIAGNOSIS — R030 Elevated blood-pressure reading, without diagnosis of hypertension: Secondary | ICD-10-CM

## 2022-12-31 DIAGNOSIS — G44219 Episodic tension-type headache, not intractable: Secondary | ICD-10-CM | POA: Diagnosis not present

## 2022-12-31 MED ORDER — ESOMEPRAZOLE MAGNESIUM 40 MG PO CPDR: 40.0000 mg | DELAYED_RELEASE_CAPSULE | Freq: Every day | ORAL | 0 refills | Status: AC

## 2022-12-31 MED ORDER — NAPROXEN 500 MG PO TABS: 500.0000 mg | ORAL_TABLET | Freq: Two times a day (BID) | ORAL | 0 refills | Status: AC
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
## 2023-01-01 LAB — CBC WITH DIFFERENTIAL/PLATELET
Basophils Absolute: 0.1 10*3/uL (ref 0.0–0.2)
Basos: 1 %
EOS (ABSOLUTE): 0.1 10*3/uL (ref 0.0–0.4)
Eos: 1 %
Hematocrit: 42.3 % (ref 37.5–51.0)
Hemoglobin: 13.8 g/dL (ref 13.0–17.7)
Immature Grans (Abs): 0 10*3/uL (ref 0.0–0.1)
Immature Granulocytes: 0 %
Lymphocytes Absolute: 3.1 10*3/uL (ref 0.7–3.1)
Lymphs: 40 %
MCH: 28.9 pg (ref 26.6–33.0)
MCHC: 32.6 g/dL (ref 31.5–35.7)
MCV: 89 fL (ref 79–97)
Monocytes Absolute: 0.6 10*3/uL (ref 0.1–0.9)
Monocytes: 8 %
Neutrophils Absolute: 3.9 10*3/uL (ref 1.4–7.0)
Neutrophils: 50 %
Platelets: 256 10*3/uL (ref 150–450)
RBC: 4.78 x10E6/uL (ref 4.14–5.80)
RDW: 12.5 % (ref 11.6–15.4)
WBC: 7.8 10*3/uL (ref 3.4–10.8)

## 2023-01-01 LAB — TSH: TSH: 2.17 u[IU]/mL (ref 0.450–4.500)

## 2023-01-01 LAB — CMP14+EGFR
ALT: 33 IU/L (ref 0–44)
AST: 20 IU/L (ref 0–40)
Albumin: 4.6 g/dL (ref 3.8–4.9)
Alkaline Phosphatase: 67 IU/L (ref 44–121)
BUN/Creatinine Ratio: 11 (ref 9–20)
BUN: 11 mg/dL (ref 6–24)
Bilirubin Total: 0.3 mg/dL (ref 0.0–1.2)
CO2: 26 mmol/L (ref 20–29)
Calcium: 9.8 mg/dL (ref 8.7–10.2)
Chloride: 100 mmol/L (ref 96–106)
Creatinine, Ser: 1.04 mg/dL (ref 0.76–1.27)
Globulin, Total: 2.9 g/dL (ref 1.5–4.5)
Glucose: 189 mg/dL — ABNORMAL HIGH (ref 70–99)
Potassium: 4.2 mmol/L (ref 3.5–5.2)
Sodium: 141 mmol/L (ref 134–144)
Total Protein: 7.5 g/dL (ref 6.0–8.5)
eGFR: 85 mL/min/{1.73_m2} (ref >59)

## 2023-01-01 LAB — LIPID PANEL
Chol/HDL Ratio: 4 ratio (ref 0.0–5.0)
Cholesterol, Total: 216 mg/dL — ABNORMAL HIGH (ref 100–199)
HDL: 54 mg/dL (ref >39)
LDL Chol Calc (NIH): 141 mg/dL — ABNORMAL HIGH (ref 0–99)
Triglycerides: 115 mg/dL (ref 0–149)
VLDL Cholesterol Cal: 21 mg/dL (ref 5–40)

## 2023-01-01 LAB — VITAMIN D 25 HYDROXY (VIT D DEFICIENCY, FRACTURES): Vit D, 25-Hydroxy: 6.2 ng/mL — ABNORMAL LOW (ref 30.0–100.0)

## 2023-01-04 MED ORDER — VITAMIN D (ERGOCALCIFEROL) 1.25 MG (50000 UNIT) PO CAPS: 50000.0000 [IU] | ORAL_CAPSULE | ORAL | 0 refills | Status: AC

## 2023-01-04 NOTE — Progress Notes (Signed)
Can we add on A1C? Cholesterol is slightly elevated. Diet encouraged - increase intake of fresh fruits and vegetables, increase intake of lean proteins. Bake, broil, or grill foods. Avoid fried, greasy, and fatty foods. Avoid fast foods. Increase intake of fiber-rich whole grains. Exercise encouraged - at least 150 minutes per week and advance as tolerated. Can also try red yeast rice and we will recheck in 3 months. Patient does have ASCVD score in intermediate category and it would be appropriate to start statin, if patient is willing.  Vitamin D level is low. I have sent in a weekly supplement to take for the next 12 weeks. After that, take a daily OTC vitamin D supplement with 1000-2000 IU. All other labs normal.    The 10-year ASCVD risk score (Arnett DK, et al., 2019) is: 8.1%   Values used to calculate the score:     Age: 57 years     Sex: Male     Is Non-Hispanic African American: Yes     Diabetic: No     Tobacco smoker: No     Systolic Blood Pressure: 143 mmHg     Is BP treated: No     HDL Cholesterol: 54 mg/dL     Total Cholesterol: 216 mg/dL

## 2023-01-04 NOTE — Addendum Note (Signed)
Addended by: Neale Burly on: 01/04/2023 08:03 AM   Modules accepted: Orders

## 2023-01-06 LAB — HGB A1C W/O EAG: Hgb A1c MFr Bld: 10.7 % — ABNORMAL HIGH (ref 4.8–5.6)

## 2023-01-06 LAB — SPECIMEN STATUS REPORT

## 2023-01-06 NOTE — Progress Notes (Signed)
Patient is in uncontrolled diabetes range. Would like to have him scheduled for next week to discuss plan/medications.

## 2023-01-11 ENCOUNTER — Ambulatory Visit (INDEPENDENT_AMBULATORY_CARE_PROVIDER_SITE_OTHER): Payer: BC Managed Care – PPO

## 2023-01-11 ENCOUNTER — Encounter: Payer: Self-pay | Admitting: Family Medicine

## 2023-01-11 ENCOUNTER — Ambulatory Visit: Payer: BC Managed Care – PPO | Admitting: Family Medicine

## 2023-01-11 ENCOUNTER — Telehealth: Payer: Self-pay

## 2023-01-11 VITALS — BP 126/75 | HR 93 | Temp 98.3°F | Ht 70.5 in | Wt 262.0 lb

## 2023-01-11 DIAGNOSIS — E1169 Type 2 diabetes mellitus with other specified complication: Secondary | ICD-10-CM

## 2023-01-11 DIAGNOSIS — F1020 Alcohol dependence, uncomplicated: Secondary | ICD-10-CM

## 2023-01-11 DIAGNOSIS — E7800 Pure hypercholesterolemia, unspecified: Secondary | ICD-10-CM

## 2023-01-11 LAB — HM DIABETES EYE EXAM

## 2023-01-11 MED ORDER — RYBELSUS 3 MG PO TABS: 3.0000 mg | ORAL_TABLET | Freq: Every day | ORAL | 0 refills | Status: AC

## 2023-01-11 MED ORDER — LANCETS MISC. MISC: 1.0000 | Freq: Three times a day (TID) | 0 refills | Status: AC

## 2023-01-11 MED ORDER — BLOOD GLUCOSE TEST VI STRP: 1.0000 | ORAL_STRIP | Freq: Three times a day (TID) | 0 refills | Status: AC

## 2023-01-11 MED ORDER — LANCET DEVICE MISC: 1.0000 | Freq: Three times a day (TID) | 0 refills | Status: AC

## 2023-01-11 MED ORDER — BLOOD GLUCOSE MONITORING SUPPL DEVI: 1.0000 | Freq: Three times a day (TID) | 0 refills | Status: AC

## 2023-01-11 NOTE — Progress Notes (Signed)
Subjective:  Patient ID: Bill Whitehead, male    DOB: 1965/08/06, 57 y.o.   MRN: [id number]  Patient Care Team: Arrie Senate, FNP as PCP - General (Family Medicine)   Chief Complaint:  elevated blood sugar and A1C  HPI: Bill Whitehead is a 57 y.o. male presenting on 01/11/2023 for elevated blood sugar and A1C  HPI Patient presents today for New Diabetes visit.   Type 2 Diabetes Mellitus with hypercholesterolemia  He reports limited knowledge of Diabetes  He does not currently check BG at home. He does not have access to a monitor.  He does not wish to start injections.  Last eye exam: has not had diabetic eye exam  Last foot exam: completed today  Last A1c:  Lab Results  Component Value Date   HGBA1C 10.7 (H) 12/31/2022   Nephropathy screen indicated?: yes  Last flu, zoster and/or pneumovax: There is no immunization history for the selected administration types on file for this patient.  ROS: Denies dizziness, LOC, polyuria, polydipsia, unintended weight loss/gain, foot ulcerations, numbness or tingling in extremities, shortness of breath or chest pain.  Alcohol Abuse  Continues to drink alcohol regularly. Reports that he only drinks beer.  Does not wish to reduce intake at this time.   Relevant past medical, surgical, family, and social history reviewed and updated as indicated.  Allergies and medications reviewed and updated. Data reviewed: Chart in Epic.  History reviewed. No pertinent past medical history.  Past Surgical History:  Procedure Laterality Date   WRIST SURGERY Left     Social History   Socioeconomic History   Marital status: Single    Spouse name: Not on file   Number of children: Not on file   Years of education: Not on file   Highest education level: Not on file  Occupational History   Not on file  Tobacco Use   Smoking status: Former    Types: Cigarettes, Cigars    Start date: 2004   Smokeless tobacco: Never  Vaping Use    Vaping status: Never Used  Substance and Sexual Activity   Alcohol use: Yes    Comment: 12 oz beer a day   Drug use: Never   Sexual activity: Not on file  Other Topics Concern   Not on file  Social History Narrative   Not on file   Social Determinants of Health   Financial Resource Strain: Not on file  Food Insecurity: Not on file  Transportation Needs: Not on file  Physical Activity: Not on file  Stress: Not on file  Social Connections: Not on file  Intimate Partner Violence: Not on file    Outpatient Encounter Medications as of 01/11/2023  Medication Sig   esomeprazole (NEXIUM) 40 MG capsule Take 1 capsule (40 mg total) by mouth daily.   naproxen (NAPROSYN) 500 MG tablet Take 1 tablet (500 mg total) by mouth 2 (two) times daily with a meal.   Vitamin D, Ergocalciferol, (DRISDOL) 1.25 MG (50000 UNIT) CAPS capsule Take 1 capsule (50,000 Units total) by mouth every 7 (seven) days for 12 doses.   No facility-administered encounter medications on file as of 01/11/2023.   No Known Allergies  Review of Systems As per HPI  Objective:  BP 126/75   Pulse 93   Temp 98.3 F (36.8 C)   Ht 5' 10.5" (1.791 m)   Wt 262 lb (118.8 kg)   SpO2 96%   BMI 37.06 kg/m  Wt Readings from Last 3 Encounters:  01/11/23 262 lb (118.8 kg)  12/31/22 257 lb 3.2 oz (116.7 kg)  02/04/20 220 lb (99.8 kg)   Physical Exam Constitutional:      General: He is awake. He is not in acute distress.    Appearance: Normal appearance. He is well-developed and well-groomed. He is obese. He is not ill-appearing, toxic-appearing or diaphoretic.  Cardiovascular:     Rate and Rhythm: Normal rate and regular rhythm.     Pulses: Normal pulses.          Radial pulses are 2+ on the right side and 2+ on the left side.       Posterior tibial pulses are 2+ on the right side and 2+ on the left side.     Heart sounds: Normal heart sounds. No murmur heard.    No gallop.  Pulmonary:     Effort: Pulmonary effort is  normal. No respiratory distress.     Breath sounds: Normal breath sounds. No stridor. No wheezing, rhonchi or rales.  Musculoskeletal:     Cervical back: Full passive range of motion without pain and neck supple.     Right lower leg: No edema.     Left lower leg: No edema.     Right foot: Normal range of motion. No deformity, bunion, Charcot foot, foot drop or prominent metatarsal heads.     Left foot: Normal range of motion. No deformity, bunion, Charcot foot, foot drop or prominent metatarsal heads.  Feet:     Right foot:     Protective Sensation: 10 sites tested.  10 sites sensed.     Skin integrity: Skin breakdown and dry skin present.     Toenail Condition: Right toenails are abnormally thick and long. Fungal disease present.    Left foot:     Protective Sensation: 10 sites tested.  10 sites sensed.     Skin integrity: Skin breakdown and dry skin present.     Toenail Condition: Left toenails are abnormally thick and long. Fungal disease present. Skin:    General: Skin is warm.     Capillary Refill: Capillary refill takes less than 2 seconds.  Neurological:     General: No focal deficit present.     Mental Status: He is alert, oriented to person, place, and time and easily aroused. Mental status is at baseline.     GCS: GCS eye subscore is 4. GCS verbal subscore is 5. GCS motor subscore is 6.     Motor: No weakness.  Psychiatric:        Attention and Perception: Attention and perception normal.        Mood and Affect: Mood and affect normal.        Speech: Speech normal.        Behavior: Behavior normal. Behavior is cooperative.        Thought Content: Thought content normal. Thought content does not include homicidal or suicidal ideation. Thought content does not include homicidal or suicidal plan.        Cognition and Memory: Cognition and memory normal.        Judgment: Judgment normal.     Results for orders placed or performed in visit on 12/31/22  CBC with  Differential/Platelet  Result Value Ref Range   WBC 7.8 3.4 - 10.8 x10E3/uL   RBC 4.78 4.14 - 5.80 x10E6/uL   Hemoglobin 13.8 13.0 - 17.7 g/dL   Hematocrit 57.8 46.9 - 51.0 %  MCV 89 79 - 97 fL   MCH 28.9 26.6 - 33.0 pg   MCHC 32.6 31.5 - 35.7 g/dL   RDW 41.3 24.4 - 01.0 %   Platelets 256 150 - 450 x10E3/uL   Neutrophils 50 Not Estab. %   Lymphs 40 Not Estab. %   Monocytes 8 Not Estab. %   Eos 1 Not Estab. %   Basos 1 Not Estab. %   Neutrophils Absolute 3.9 1.4 - 7.0 x10E3/uL   Lymphocytes Absolute 3.1 0.7 - 3.1 x10E3/uL   Monocytes Absolute 0.6 0.1 - 0.9 x10E3/uL   EOS (ABSOLUTE) 0.1 0.0 - 0.4 x10E3/uL   Basophils Absolute 0.1 0.0 - 0.2 x10E3/uL   Immature Granulocytes 0 Not Estab. %   Immature Grans (Abs) 0.0 0.0 - 0.1 x10E3/uL  CMP14+EGFR  Result Value Ref Range   Glucose 189 (H) 70 - 99 mg/dL   BUN 11 6 - 24 mg/dL   Creatinine, Ser 2.72 0.76 - 1.27 mg/dL   eGFR 85 >53 GU/YQI/3.47   BUN/Creatinine Ratio 11 9 - 20   Sodium 141 134 - 144 mmol/L   Potassium 4.2 3.5 - 5.2 mmol/L   Chloride 100 96 - 106 mmol/L   CO2 26 20 - 29 mmol/L   Calcium 9.8 8.7 - 10.2 mg/dL   Total Protein 7.5 6.0 - 8.5 g/dL   Albumin 4.6 3.8 - 4.9 g/dL   Globulin, Total 2.9 1.5 - 4.5 g/dL   Bilirubin Total 0.3 0.0 - 1.2 mg/dL   Alkaline Phosphatase 67 44 - 121 IU/L   AST 20 0 - 40 IU/L   ALT 33 0 - 44 IU/L  Lipid panel  Result Value Ref Range   Cholesterol, Total 216 (H) 100 - 199 mg/dL   Triglycerides 425 0 - 149 mg/dL   HDL 54 >95 mg/dL   VLDL Cholesterol Cal 21 5 - 40 mg/dL   LDL Chol Calc (NIH) 638 (H) 0 - 99 mg/dL   Chol/HDL Ratio 4.0 0.0 - 5.0 ratio  TSH  Result Value Ref Range   TSH 2.170 0.450 - 4.500 uIU/mL  VITAMIN D 25 Hydroxy (Vit-D Deficiency, Fractures)  Result Value Ref Range   Vit D, 25-Hydroxy 6.2 (L) 30.0 - 100.0 ng/mL  Hgb A1c w/o eAG  Result Value Ref Range   Hgb A1c MFr Bld 10.7 (H) 4.8 - 5.6 %  Specimen status report  Result Value Ref Range   specimen status  report Comment        01/11/2023    9:53 AM 12/31/2022    8:23 AM  Depression screen PHQ 2/9  Decreased Interest 0 1  Down, Depressed, Hopeless 0 0  PHQ - 2 Score 0 1  Altered sleeping 1 3  Tired, decreased energy 1 3  Change in appetite 0 0  Feeling bad or failure about yourself  0 0  Trouble concentrating 0 0  Moving slowly or fidgety/restless 0 0  Suicidal thoughts 0 0  PHQ-9 Score 2 7  Difficult doing work/chores Not difficult at all Somewhat difficult       01/11/2023    9:53 AM 12/31/2022    8:24 AM  GAD 7 : Generalized Anxiety Score  Nervous, Anxious, on Edge 0 0  Control/stop worrying 0 0  Worry too much - different things 0 0  Trouble relaxing 1 1  Restless 0 1  Easily annoyed or irritable 0 0  Afraid - awful might happen 0 0  Total GAD 7 Score 1  2  Anxiety Difficulty Somewhat difficult Somewhat difficult   The 10-year ASCVD risk score (Arnett DK, et al., 2019) is: 12%   Values used to calculate the score:     Age: 38 years     Sex: Male     Is Non-Hispanic African American: Yes     Diabetic: Yes     Tobacco smoker: No     Systolic Blood Pressure: 126 mmHg     Is BP treated: No     HDL Cholesterol: 54 mg/dL     Total Cholesterol: 216 mg/dL  Pertinent labs & imaging results that were available during my care of the patient were reviewed by me and considered in my medical decision making.  Assessment & Plan:  Buck was seen today for elevated blood sugar and a1c.  Diagnoses and all orders for this visit:  Type 2 diabetes mellitus with other specified complication, without long-term current use of insulin (HCC) Spent majority of visit with patient education. Provided multiple handouts for patient.  Labs as below. Will communicate results to patient once available. Will await results to  determine next steps.  Will start medication as below. Discussed side effects with patient Discussed monitoring of BG at home. Recommended patient monitor once daily and  for any change in symptoms.  -     Microalbumin / creatinine urine ratio -     Semaglutide (RYBELSUS) 3 MG TABS; Take 1 tablet (3 mg total) by mouth daily. -     Blood Glucose Monitoring Suppl DEVI; 1 each by Does not apply route in the morning, at noon, and at bedtime. May substitute to any manufacturer covered by patient's insurance. -     Glucose Blood (BLOOD GLUCOSE TEST STRIPS) STRP; 1 each by In Vitro route in the morning, at noon, and at bedtime. May substitute to any manufacturer covered by patient's insurance. -     Lancet Device MISC; 1 each by Does not apply route in the morning, at noon, and at bedtime. May substitute to any manufacturer covered by patient's insurance. -     Lancets Misc. MISC; 1 each by Does not apply route in the morning, at noon, and at bedtime. May substitute to any manufacturer covered by patient's insurance. -     AMB Referral to Pharmacy Medication Management  Uncomplicated alcohol dependence (HCC) Discussed with patient reducing intake. Patient does not wish to pursue detoxification at this time.   Pure hypercholesterolemia Reviewed ASCVD risk score with patient. Elevated score. Recommended a statin. He does not wish to start at this time.    Continue all other maintenance medications.  Follow up plan: Return in about 4 weeks (around 02/08/2023) for Chronic Condition Follow up.  Continue healthy lifestyle choices, including diet (rich in fruits, vegetables, and lean proteins, and low in salt and simple carbohydrates) and exercise (at least 30 minutes of moderate physical activity daily).  Written and verbal instructions provided   The above assessment and management plan was discussed with the patient. The patient verbalized understanding of and has agreed to the management plan. Patient is aware to call the clinic if they develop any new symptoms or if symptoms persist or worsen. Patient is aware when to return to the clinic for a follow-up visit.  Patient educated on when it is appropriate to go to the emergency department.   Neale Burly, DNP-FNP [facility] [address] [place], [place] [address] [phone]

## 2023-01-11 NOTE — Progress Notes (Signed)
Bill Whitehead arrived 01/11/2023 and has given verbal consent to obtain images and complete their overdue diabetic retinal screening.  The images have been sent to an ophthalmologist or optometrist for review and interpretation.  Results will be sent back to Arrie Senate, FNP for review.  Patient has been informed they will be contacted when we receive the results via telephone or MyChart

## 2023-01-11 NOTE — Progress Notes (Unsigned)
Care Guide Note  01/11/2023 Name: Bill Whitehead MRN: [id number] DOB: July 24, 1965  Referred by: Arrie Senate, FNP Reason for referral : Care Coordination (Outreach to schedule with Pharm d )   Bill Whitehead is a 57 y.o. year old male who is a primary care patient of Arrie Senate, FNP. [name] was referred to the pharmacist for assistance related to DM.    An unsuccessful telephone outreach was attempted today to contact the patient who was referred to the pharmacy team for assistance with medication management. Additional attempts will be made to contact the patient.   [name], RMA Care Guide [facility]  [place], [place] [address] Direct Dial: [phone] [email or web address]

## 2023-01-12 NOTE — Progress Notes (Signed)
No acute abnormalities on imaging for left knee. 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 Continue current regimens. Can refer to PT if patient would like.

## 2023-01-13 ENCOUNTER — Telehealth: Payer: Self-pay

## 2023-01-13 NOTE — Telephone Encounter (Signed)
Bill Whitehead (Key: [id number]) Rx #: [id number] Rybelsus 3MG  tablets Form [facility] Electronic Prior Authorization Form (2017 NCPDP) Created 2 days ago Sent to Plan 4 minutes ago Plan Response 3 minutes ago Submit Clinical Questions less than a minute ago Determination Wait for Determination Please wait for [facility] 2017 NCPDP to return a determination.

## 2023-01-13 NOTE — Progress Notes (Signed)
Normal lab. Will repeat in one year

## 2023-01-14 NOTE — Progress Notes (Unsigned)
Care Guide Note  01/14/2023 Name: Bill Whitehead MRN: [id number] DOB: 12-17-1965  Referred by: Arrie Senate, FNP Reason for referral : Care Coordination (Outreach to schedule with Pharm d )   Bill Whitehead is a 57 y.o. year old male who is a primary care patient of Arrie Senate, FNP. [name] was referred to the pharmacist for assistance related to DM.    A second unsuccessful telephone outreach was attempted today to contact the patient who was referred to the pharmacy team for assistance with medication management. Additional attempts will be made to contact the patient.  [name], RMA Care Guide [facility]  [place], [place] [address] Direct Dial: [phone] [email or web address]

## 2023-01-17 NOTE — Progress Notes (Signed)
Care Guide Note  01/17/2023 Name: Bill Whitehead MRN: [id number] DOB: 1965/09/07  Referred by: Arrie Senate, FNP Reason for referral : Care Coordination (Outreach to schedule with Pharm d )   Bill Whitehead is a 57 y.o. year old male who is a primary care patient of Arrie Senate, FNP. [name] was referred to the pharmacist for assistance related to DM.    A third unsuccessful telephone outreach was attempted today to contact the patient who was referred to the pharmacy team for assistance with medication management. The Population Health team is pleased to engage with this patient at any time in the future upon receipt of referral and should he/she be interested in assistance from the [facility] team.   [name], RMA Care Guide [facility]  [place], [place] [address] Direct Dial: [phone] [email or web address]

## 2023-01-18 NOTE — Telephone Encounter (Signed)
Pharmacy Patient Advocate Encounter  Received notification from [facility] that Prior Authorization for Rybelsus 3MG  tablets has been APPROVED from 01/13/23 to 01/13/24   PA #/Case ID/Reference #: [id number]

## 2023-01-19 ENCOUNTER — Telehealth: Payer: Self-pay | Admitting: Family Medicine

## 2023-01-19 NOTE — Telephone Encounter (Signed)
Spoke to patient. He was calling to make sure his phone #was updated.  While I had him on the phone, I gave him his results

## 2023-01-21 NOTE — Progress Notes (Signed)
No retinopathy. Repeat in one year.

## 2023-01-29 ENCOUNTER — Other Ambulatory Visit: Payer: Self-pay | Admitting: Family Medicine

## 2023-01-29 DIAGNOSIS — G8929 Other chronic pain: Secondary | ICD-10-CM

## 2023-07-27 ENCOUNTER — Telehealth: Payer: Self-pay | Admitting: Pharmacist

## 2023-07-27 DIAGNOSIS — E1169 Type 2 diabetes mellitus with other specified complication: Secondary | ICD-10-CM

## 2023-07-27 NOTE — Telephone Encounter (Signed)
 Patient was identified as falling into the [place] Measure - Diabetes.   Patient was: Referred to pharmacy for chronic disease management.   Medication and lifestyle optimization needed for patient living with Type 2 DM Last A1c was 10.7% on 01/18/23 with no future follow up scheduled [id number] placed for disease state management  Kieth Brightly, PharmD, BCACP, CPP Clinical Pharmacist, [facility]

## 2023-08-01 ENCOUNTER — Telehealth: Payer: Self-pay

## 2023-08-01 NOTE — Progress Notes (Signed)
 Care Guide Pharmacy Note  08/01/2023 Name: [name] MRN: [id number] DOB: August 22, 1965  Referred By: Arrie Senate, FNP Reason for referral: Complex Care Management (Outreach to schedule with Pharm d )   Bill Whitehead is a 58 y.o. year old male who is a primary care patient of Arrie Senate, FNP.  Bill Whitehead was referred to the pharmacist for assistance related to: DMII  An unsuccessful telephone outreach was attempted today to contact the patient who was referred to the pharmacy team for assistance with medication management. Additional attempts will be made to contact the patient.  Bill Whitehead , RMA     [facility]  [facility], [facility] Guide  Direct Dial: [phone]  Website: [email or web address]

## 2023-08-16 NOTE — Progress Notes (Signed)
 Care Guide Pharmacy Note  08/16/2023 Name: Bill Whitehead MRN: [id number] DOB: 12-06-1965  Referred By: Chrystine Crate, FNP Reason for referral: Complex Care Management (Outreach to schedule with Pharm d )   Bill Whitehead is a 58 y.o. year old male who is a primary care patient of Chrystine Crate, FNP.  [name] was referred to the pharmacist for assistance related to: DMII  A second unsuccessful telephone outreach was attempted today to contact the patient who was referred to the pharmacy team for assistance with medication management. Additional attempts will be made to contact the patient.  [name] , RMA     [facility]  [facility], [facility] Guide  Direct Dial: [phone]  Website: [name]

## 2023-08-19 NOTE — Progress Notes (Signed)
 Care Guide Pharmacy Note  08/19/2023 Name: Bill Whitehead MRN: [id number] DOB: August 09, 1965  Referred By: Chrystine Crate, FNP Reason for referral: Complex Care Management (Outreach to schedule with Pharm d )   Bill Whitehead is a 58 y.o. year old male who is a primary care patient of Chrystine Crate, FNP.  Bill Whitehead was referred to the pharmacist for assistance related to: DMII  A third unsuccessful telephone outreach was attempted today to contact the patient who was referred to the pharmacy team for assistance with medication management. The Population Health team is pleased to engage with this patient at any time in the future upon receipt of referral and should he/she be interested in assistance from the [facility] team.  Bill Whitehead , RMA     [facility]  [facility], [facility] Guide  Direct Dial: [phone]  Website: [name]

## 2023-10-20 ENCOUNTER — Telehealth: Payer: Self-pay

## 2023-10-20 NOTE — Progress Notes (Signed)
 Complex Care Management Care Guide Note  10/20/2023 Name: Bill Whitehead MRN: [id number] DOB: [birth year]  Bill Whitehead is a 58 y.o. year old male who is a primary care patient of Cathlene, Marry Lenis, FNP and is actively engaged with the care management team. I reached out to Lamar GORMAN Quale by phone today to assist with scheduling  with the Pharmacist.  Follow up plan: Unsuccessful telephone outreach attempt made. A HIPAA compliant phone message was left for the patient providing contact information and requesting a return call.  Jeoffrey Buffalo , RMA     [facility]  [facility], [facility] Guide  Direct Dial: [phone]  Website: [email or web address]

## 2023-10-25 ENCOUNTER — Telehealth: Payer: Self-pay

## 2023-10-25 NOTE — Progress Notes (Signed)
 Complex Care Management Care Guide Note  10/25/2023 Name: Bill Whitehead MRN: [id number] DOB: [birth year]  Bill Whitehead is a 58 y.o. year old male who is a primary care patient of Cathlene, Marry Lenis, FNP and is actively engaged with the care management team. I reached out to Bill Whitehead by phone today to assist with scheduling  with the Pharmacist.  Follow up plan: Unsuccessful telephone outreach attempt made. A HIPAA compliant phone message was left for the patient providing contact information and requesting a return call.  Jeoffrey Buffalo , RMA     [facility]  [facility], [facility] Guide  Direct Dial: [phone]  Website: [email or web address]

## 2023-10-25 NOTE — Progress Notes (Signed)
 Error

## 2023-10-27 ENCOUNTER — Telehealth: Payer: Self-pay

## 2023-10-27 NOTE — Progress Notes (Signed)
 Care Guide Pharmacy Note  10/27/2023 Name: Bill Whitehead MRN: [id number] DOB: 1965/10/27  Referred By: Cathlene Marry Lenis, FNP (Inactive) Reason for referral: Complex Care Management (Outreach to schedule f/u with Pharm d Face to face 2-4 weeks)   Bill Whitehead is a 58 y.o. year old male who is a primary care patient of Cathlene, Marry Lenis, FNP (Inactive).  Bill Whitehead was referred to the pharmacist for assistance related to: DMII  A third unsuccessful telephone outreach was attempted today to contact the patient who was referred to the pharmacy team for assistance with medication management. The Population Health team is pleased to engage with this patient at any time in the future upon receipt of referral and should he/she be interested in assistance from the [facility] team.  Bill Whitehead , RMA     [facility]  [facility], [facility] Guide  Direct Dial: [phone]  Website: [email or web address]

## 2023-10-27 NOTE — Progress Notes (Signed)
 Error

## 2023-12-20 ENCOUNTER — Other Ambulatory Visit (HOSPITAL_COMMUNITY): Payer: Self-pay

## 2024-04-06 DIAGNOSIS — K047 Periapical abscess without sinus: Secondary | ICD-10-CM | POA: Diagnosis not present
